# Patient Record
Sex: Female | Born: 1960 | Race: White | Hispanic: No | Smoking: Former smoker
Health system: Southern US, Community
[De-identification: ages and names within clinical notes are randomized; demographics above are authoritative.]

## PROBLEM LIST (undated history)

## (undated) DIAGNOSIS — I10 Essential (primary) hypertension: Secondary | ICD-10-CM

## (undated) DIAGNOSIS — E119 Type 2 diabetes mellitus without complications: Secondary | ICD-10-CM

## (undated) DIAGNOSIS — F988 Other specified behavioral and emotional disorders with onset usually occurring in childhood and adolescence: Secondary | ICD-10-CM

## (undated) HISTORY — PX: CHOLECYSTECTOMY: SHX55

## (undated) HISTORY — PX: ABDOMINAL HYSTERECTOMY: SHX81

---

## 1999-03-10 ENCOUNTER — Other Ambulatory Visit: Admission: RE | Admit: 1999-03-10 | Discharge: 1999-03-10 | Payer: Self-pay | Admitting: Gynecology

## 1999-08-19 ENCOUNTER — Ambulatory Visit (HOSPITAL_COMMUNITY): Admission: RE | Admit: 1999-08-19 | Discharge: 1999-08-19 | Payer: Self-pay | Admitting: Gastroenterology

## 1999-08-19 ENCOUNTER — Encounter: Payer: Self-pay | Admitting: Gastroenterology

## 1999-09-07 ENCOUNTER — Encounter: Payer: Self-pay | Admitting: General Surgery

## 1999-09-10 ENCOUNTER — Encounter (INDEPENDENT_AMBULATORY_CARE_PROVIDER_SITE_OTHER): Payer: Self-pay | Admitting: Specialist

## 1999-09-10 ENCOUNTER — Encounter: Payer: Self-pay | Admitting: General Surgery

## 1999-09-10 ENCOUNTER — Observation Stay (HOSPITAL_COMMUNITY): Admission: RE | Admit: 1999-09-10 | Discharge: 1999-09-11 | Payer: Self-pay | Admitting: General Surgery

## 1999-09-28 ENCOUNTER — Encounter: Payer: Self-pay | Admitting: General Surgery

## 1999-09-28 ENCOUNTER — Ambulatory Visit (HOSPITAL_COMMUNITY): Admission: RE | Admit: 1999-09-28 | Discharge: 1999-09-28 | Payer: Self-pay | Admitting: General Surgery

## 1999-09-29 ENCOUNTER — Encounter: Payer: Self-pay | Admitting: General Surgery

## 1999-09-29 ENCOUNTER — Inpatient Hospital Stay (HOSPITAL_COMMUNITY): Admission: EM | Admit: 1999-09-29 | Discharge: 1999-10-02 | Payer: Self-pay | Admitting: General Surgery

## 1999-09-30 ENCOUNTER — Encounter: Payer: Self-pay | Admitting: General Surgery

## 1999-10-01 ENCOUNTER — Encounter: Payer: Self-pay | Admitting: General Surgery

## 1999-10-02 ENCOUNTER — Encounter: Payer: Self-pay | Admitting: General Surgery

## 1999-10-21 ENCOUNTER — Encounter: Payer: Self-pay | Admitting: Gastroenterology

## 1999-10-21 ENCOUNTER — Encounter: Admission: RE | Admit: 1999-10-21 | Discharge: 1999-10-21 | Payer: Self-pay | Admitting: Gastroenterology

## 2000-01-11 ENCOUNTER — Ambulatory Visit (HOSPITAL_COMMUNITY): Admission: RE | Admit: 2000-01-11 | Discharge: 2000-01-11 | Payer: Self-pay | Admitting: Gastroenterology

## 2000-01-27 ENCOUNTER — Encounter: Payer: Self-pay | Admitting: Emergency Medicine

## 2000-01-27 ENCOUNTER — Emergency Department (HOSPITAL_COMMUNITY): Admission: EM | Admit: 2000-01-27 | Discharge: 2000-01-27 | Payer: Self-pay | Admitting: Emergency Medicine

## 2002-12-24 ENCOUNTER — Observation Stay (HOSPITAL_COMMUNITY): Admission: EM | Admit: 2002-12-24 | Discharge: 2002-12-26 | Payer: Self-pay | Admitting: Emergency Medicine

## 2002-12-24 ENCOUNTER — Encounter: Payer: Self-pay | Admitting: Emergency Medicine

## 2002-12-26 ENCOUNTER — Encounter: Payer: Self-pay | Admitting: Cardiology

## 2005-08-24 ENCOUNTER — Encounter: Admission: RE | Admit: 2005-08-24 | Discharge: 2005-08-24 | Payer: Self-pay | Admitting: Family Medicine

## 2006-07-17 ENCOUNTER — Ambulatory Visit: Payer: Self-pay | Admitting: Cardiology

## 2006-07-17 ENCOUNTER — Encounter: Payer: Self-pay | Admitting: Cardiology

## 2006-07-17 ENCOUNTER — Inpatient Hospital Stay (HOSPITAL_COMMUNITY): Admission: EM | Admit: 2006-07-17 | Discharge: 2006-07-19 | Payer: Self-pay | Admitting: Emergency Medicine

## 2006-09-06 ENCOUNTER — Encounter: Admission: RE | Admit: 2006-09-06 | Discharge: 2006-09-06 | Payer: Self-pay | Admitting: Family Medicine

## 2007-04-11 ENCOUNTER — Emergency Department (HOSPITAL_COMMUNITY): Admission: EM | Admit: 2007-04-11 | Discharge: 2007-04-11 | Payer: Self-pay | Admitting: Emergency Medicine

## 2007-04-13 ENCOUNTER — Emergency Department (HOSPITAL_COMMUNITY): Admission: EM | Admit: 2007-04-13 | Discharge: 2007-04-13 | Payer: Self-pay | Admitting: *Deleted

## 2007-08-09 ENCOUNTER — Emergency Department (HOSPITAL_COMMUNITY): Admission: EM | Admit: 2007-08-09 | Discharge: 2007-08-09 | Payer: Self-pay | Admitting: Emergency Medicine

## 2008-02-19 ENCOUNTER — Encounter: Admission: RE | Admit: 2008-02-19 | Discharge: 2008-02-19 | Payer: Self-pay | Admitting: Family Medicine

## 2009-07-20 ENCOUNTER — Emergency Department (HOSPITAL_COMMUNITY): Admission: EM | Admit: 2009-07-20 | Discharge: 2009-07-20 | Payer: Self-pay | Admitting: Emergency Medicine

## 2009-08-18 ENCOUNTER — Encounter: Admission: RE | Admit: 2009-08-18 | Discharge: 2009-08-18 | Payer: Self-pay | Admitting: Family Medicine

## 2010-08-23 ENCOUNTER — Encounter: Admission: RE | Admit: 2010-08-23 | Discharge: 2010-08-23 | Payer: Self-pay | Admitting: Family Medicine

## 2011-03-30 IMAGING — CR DG TOE GREAT 2+V*R*
3 series · 3 of 3 positions shown · non-contrast
Comparison: None

CLINICAL DATA: Injury. Torn toenail

RIGHT TOE - 2+ VIEW

[t toes ap right]
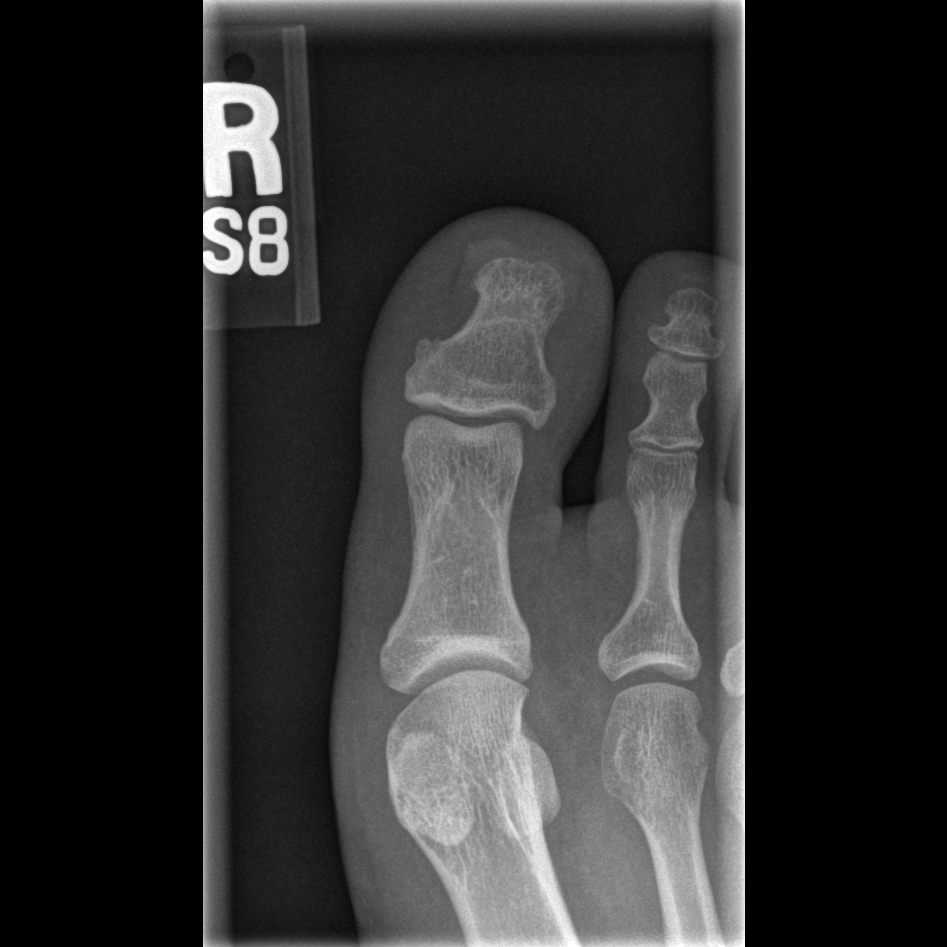

[t toes oblique right]
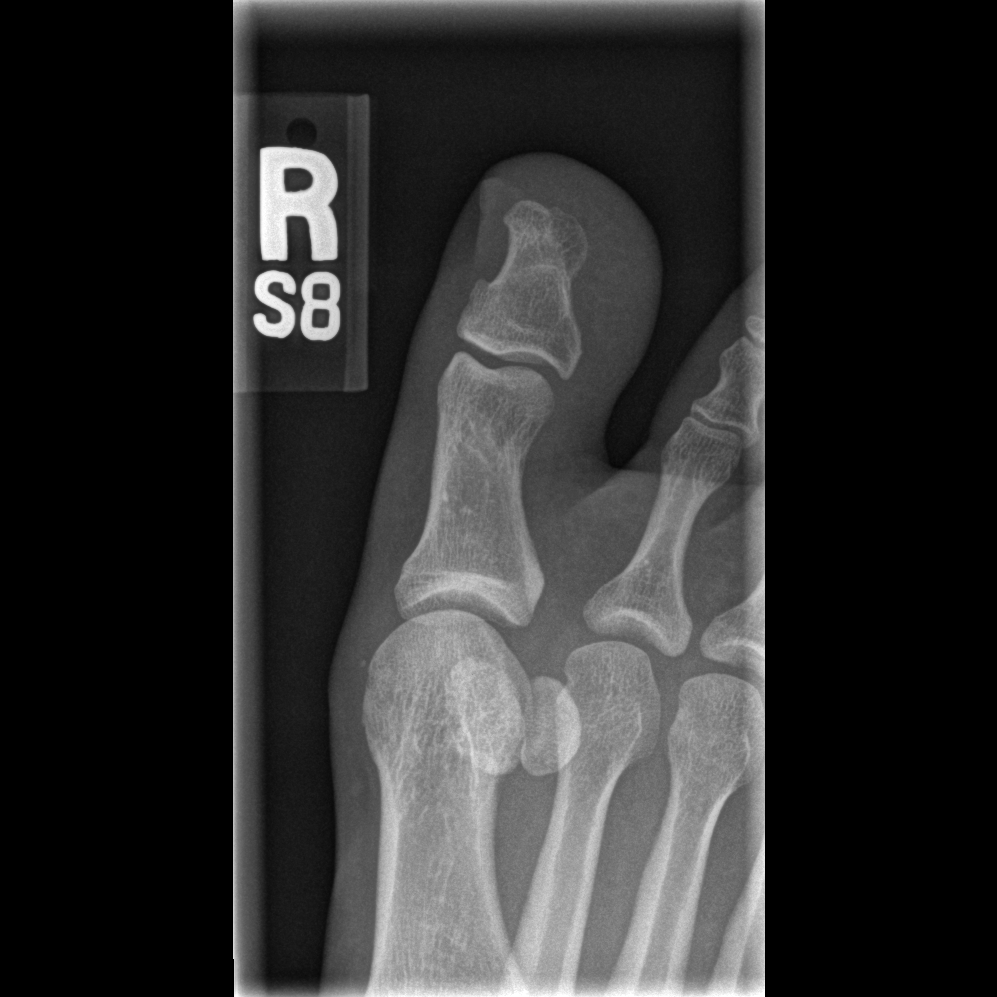

[t toes lateral right]
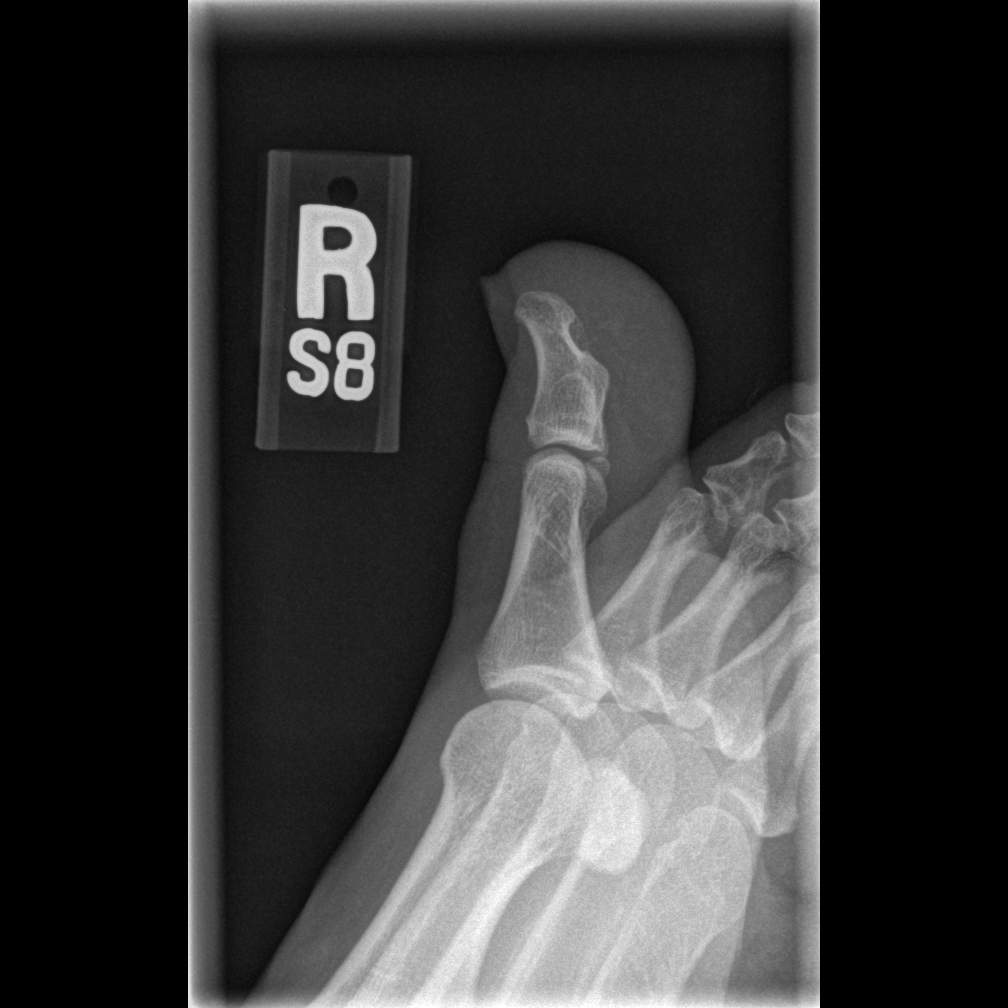

[3 of 3 positions shown; findings below may reference images not displayed]

FINDINGS: There is no evidence of fracture or dislocation.  There
is no evidence of arthropathy or other focal bone abnormality.
Soft tissues are unremarkable.
IMPRESSION: Negative.

## 2011-04-08 NOTE — Discharge Summary (Signed)
NAMEALVEENA, Sonya Daugherty NO.:  [id number]   MEDICAL RECORD NO.:  [id number]          PATIENT TYPE:  OBV   LOCATION:  2008                         FACILITY:  [facility]   PHYSICIAN:  Theodore Demark, PA-C   DATE OF BIRTH:  Apr 09, 1961   DATE OF ADMISSION:  07/16/2006  DATE OF DISCHARGE:                                 DISCHARGE SUMMARY   PROCEDURES:  Gated exercise treadmill Myoview.   DISCHARGE DIAGNOSES:  1. Chest pain.  2. Hypertension.  3. Hyperlipidemia.  4. Dizziness and confusion at the time of chest pain which has resolved.  5. Remote history of tobacco abuse.  6. Remote history of C-section x3, cholecystectomy and hysterectomy as      well as melanoma.  7. History of melanoma with resection.  8. History of atypical chest pain, 2001-1004  9. Status post EGD and colonoscopy secondary to right upper quadrant and      epigastric pain with blood in the stool.  10.Attention deficit disorder.  11.Depression and anxiety.  12.Allergy or intolerance to CODEINE AND VICODIN.  13.Family history of premature coronary artery disease in her father and      brother.   Time at discharge 34 minutes.   HOSPITAL COURSE:  [name] is a 50 year old female with a previous  history of evaluation for chest pain in 2004 but no known coronary artery  disease.  She had chest pain onset while playing basketball that was  associated with dizziness and confusion.  She came to the hospital where she  was admitted for further evaluation and treatment.   Her cardiac enzymes were negative for MI.  She had a gated exercise  treadmill stress test which was without ischemic changes on her EKG and  Myoview images are pending at the time of dictation.  She had recurrence of  pain for which she was treated with Darvocet.  At this time her Myoview  images are not read in evaluation by Dr. Dietrich Pates is pending.  If he does  not feel any further inpatient workup is indicated and the stress test  is  without ischemia or infarct, she is tentatively considered stable for  discharge with outpatient follow-up arranged.   DISCHARGE INSTRUCTIONS:  Her activity level is to be increased gradually.  She is to stick to a low-fat diet.  She is to follow up with the PA for Dr.  Dietrich Pates in the [facility] office on September 11 at 9:15 and with Dr. Sigmund Hazel as needed or as scheduled.   DISCHARGE MEDICATIONS:  1. Altace 10 mg q.d.  2. HCTZ 25 mg a day.  3. Wellbutrin 300 mg a day.  4. Aspirin 325 mg a day.  5. Ativan 0.5 mg q.4 h p.r.n.           ______________________________  Theodore Demark, PA-C     RB/MEDQ  D:  07/17/2006  T:  07/18/2006  Job:  [id number]   cc:   Sigmund Hazel, M.D.

## 2011-04-08 NOTE — Cardiovascular Report (Signed)
[name], [name] NO.:  [id number]   MEDICAL RECORD NO.:  [id number]          PATIENT TYPE:  OBV   LOCATION:  2008                         FACILITY:  [facility]   PHYSICIAN:  Sonya Pick. Nishan, MD,FACCDATE OF BIRTH:  1960-12-18   DATE OF PROCEDURE:  DATE OF DISCHARGE:                              CARDIAC CATHETERIZATION   PROCEDURE:  TEE.   INDICATIONS:  Mental status changes in a 50 year old   This study was requested by neurology. The patient was sedated with 8 mg of  Versed and 100 mcg of fentanyl.   Using digital technique, an Omniplane probe was advanced in the distal  esophagus without incident.   Transgastric imaging revealed normal LV function with an EF of 60%.  Mitral,  aortic, tricuspid and pulmonary valves were structurally normal.  There was  mild mitral insufficiency but no prolapse.   Atrial septum was intact by color-flow, there is no PFO, bubble study was  negative for right-to-left shunt. Imaging of the left atrial appendage was  normal with no spontaneous contrast and no thrombus. Imaging of the aorta  showed no significant debris.   IMPRESSION:  1. No source of embolus.  2. Negative bubble study.  3. No patent foramen ovale.  4. Mild mitral regurgitation.  5. Normal aortic, tricuspid and pulmonary valves.  6. Normal left ventricular function.  7. Normal right-sided cardiac chambers.  8. No aortic debris.           ______________________________  Sonya Pick. Eden Emms, MD,FACC     PCN/MEDQ  D:  07/18/2006  T:  07/19/2006  Job:  [id number]

## 2011-04-08 NOTE — Procedures (Signed)
[place]. [facility]  Patient:    Sonya Daugherty, Sonya Daugherty                   MRN: [id number] Proc. Date: 01/11/00 Adm. Date:  [id number] Attending:  Charna Elizabeth CC:         Gaetano Hawthorne. Lily Peer, M.D.             Adolph Pollack, M.D.                           Procedure Report  DATE OF BIRTH:  10/14/1961  REFERRING PHYSICIAN:  Gaetano Hawthorne. Lily Peer, M.D.  PROCEDURE PERFORMED:  Colonoscopy.  ENDOSCOPIST:  Anselmo Rod, M.D.  INSTRUMENT USED:  Olympus video colonoscope.  INDICATIONS:  Rectal bleeding and abdominal pain in a 50 year old white female.  Rule out masses, polyps, etc.  PREPROCEDURE PREPARATION:  Informed consent was procured from the patient.  The  patient was fasted for 8 hours prior to the procedure and prepped with a bottle of magnesium citrate and a gallon of NuLytely the night prior to the procedure.  PREPROCEDURE PHYSICAL:  Patient has stable vital signs.  NECK: Supple.  CHEST:  Clear to auscultation. S1, S2 regular.  ABDOMEN:  Soft with normal abdominal bowel sounds.  Epigastric and right upper quadrant tenderness on palpation.  DESCRIPTION OF PROCEDURE:  The patient was placed in left lateral decubitus position and sedated with an additional 20 mg of Demerol and 2 mg of Versed intravenously.   Once the patient was adequately sedated and maintained on low-flow oxygen and continuous cardiac monitoring, the Olympus video colonoscope was advanced from the rectum to the cecum without difficulty.  Except for small, nonbleeding internal hemorrhoids, no other abnormalities were seen.  There was o evidence of masses, polyps, erosions, ulcerations or diverticular disease.  The  patient  tolerated the procedure well without complication.  IMPRESSION: 1. Normal colonoscopy. 2. Small, nonbleeding internal hemorrhoids. 3. No masses or polyps seen.  RECOMMENDATIONS: 1. Patient has been advised to increase the fluid and fiber in  her diet.  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 2. Outpatient follow-up was advised in the next two weeks.  Further    recommendations would be made at that time. DD:  01/11/00 TD:  01/11/00 Job: [id number]

## 2011-04-08 NOTE — H&P (Signed)
Sonya Daugherty, LIMBERT NO.:  [id number]   MEDICAL RECORD NO.:  [id number]          PATIENT TYPE:  EMS   LOCATION:  [facility]JO                         FACILITY:  [facility]   PHYSICIAN:  Marrian Salvage. Freida Busman, MD     DATE OF BIRTH:  [birth year]   DATE OF ADMISSION:  07/17/2006  DATE OF DISCHARGE:                                HISTORY & PHYSICAL   CARDIOLOGIST:  Gerrit Friends. Dietrich Pates, M.D.   PRIMARY MEDICAL DOCTOR:  Sigmund Hazel, M.D. at [facility].   CHIEF COMPLAINT:  Chest pain, presyncope and confusion.   HISTORY OF PRESENT ILLNESS:  The patient is a 50 year old female with  cardiac risk factors of hypertension, prior tobacco, borderline  hyperlipidemia and a strong positive family history for coronary disease.  She has a history of chest pain back in March 2001 with a negative  evaluation and negative workup for a pulmonary embolus.  She was again  admitted to [facility] in February 2004 for chest pain at that time had rule-  out myocardial infarction that was negative as well as an exercise  Cardiolite on December 26, 2002 which evoked her chest pain, showed an EF of  62%, and showed no significant perfusion abnormality on stress or rest.   The patient has been treated for some hypertension.  Recently, she had some  headaches and anxiety.  She was put on Ativan a couple of weeks ago.  Due to  the headaches, she saw her primary medical doctor on Thursday, July 13, 2006.  At that point in time, her Altace was increased from 5 mg to 10 mg  and she was given a prescription for hydrochlorothiazide.  The following  day, July 14, 2006, at work, she felt very stressed out and while sitting  at the phone, she noted her heart pounding.  She had some mild shortness of  breath associated with this.  However, there was no chest pain and no  lightheadedness or dizziness.  She has somebody in office to come and  evaluate her and a blood pressure at that time was 160/114.  She rested  and  felt better and consequently did not seek further medical care.   She did well over the remainder the weekend, until tonight, July 16, 2006.  She and her family went out to play basketball about 6:45 p.m.  They are  very serious about their basketball and she had heavy exertion.  She started  getting some dull tightness and pain in the left clavicular region.  At most  it was 06/10 in severity and the pain waxed and waned.  She kept playing  basketball.  Eventually, she started getting dizzy.  She sat down and then  became confused.  She does not remember the details of the event after that.  Her husband says she was quite dizzy and became somewhat confused,  disoriented and a little bit combative.  This was around 8:30 p.m.  She was  in a cold sweat.  The weather was quite hot and humid, as it is in mid  August.  She remained disoriented  for about 10-15 minutes.  EMS was called.  By the time they arrived, she was improved.  Their initial evaluation was  negative.   In the emergency department, the patient was mildly hypertensive.  Heart  rate was normal.  Her disorientation had resolved.  She was in no  significant chest pain.  She was given some aspirin and we were called for  admission.   PAST MEDICAL HISTORY:  1. Hypertension.  2. Hyperlipidemia.  3. Prior tobacco abuse.  4. History of C-section x3.  5. Status post cholecystectomy.  6. Status post hysterectomy with unilateral salpingo-oophorectomy.  7. History of melanoma, status post resection in 2000.  8. History of atypical chest pain in 2001 and 2004.  9. History of EGD, which was negative.  10.Attention deficit disorder.  11.Depression and anxiety.   ALLERGIES:  Codeine and VICODIN, which both cause nausea.   MEDICATIONS:  1. Altace 10 mg, which was recently increased from 5 mg.  2. Hydrochlorothiazide 25 mg daily, which the patient has not yet filled      the prescription for and has not had started.  3.  Ativan 0.5 mg as needed for anxiety.  4. Adderall 30 mg in the morning, 20 mg at night.  5. Wellbutrin 300 mg in the morning.   SOCIAL HISTORY:  The patient lives in [place] with her husband.  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  She  says her job is quite stressful.  The patient has 3 children from a prior  marriage.  She smoked for about 30 years, but quit in January of this year.  She does not use significant alcohol.  She denied any illicit drug use and  specifically denied cocaine use.   FAMILY HISTORY:  The patient's father died of a myocardial infarction at age  103.  She has a brother who had a CABG at age 80.  Her mother is alive and  well, but has diabetes and heart failure.  She has another brother who died  of brain cancer at age 40.   REVIEW OF SYSTEMS:  Positive for chest pain, confusion and stress.  Otherwise, 14 systems were reviewed and negative in detail.   ADVANCE DIRECTIVES:  Full code.   PHYSICAL EXAMINATION:  VITAL SIGNS:  Temperature 98, pulse 76, respirations  16, blood pressure 114/85, saturation 99% on 2 L.  Her weight is 160 pounds.  GENERAL:  She was in no distress.  SKIN:  No rash.  No diaphoresis.  NECK:  No carotid bruits.  JVP flat.  Thyroid without enlargement.  LUNGS:  Clear to auscultation bilaterally.  HEART:  Regular rate and rhythm with normal S1 and S2, no S4 and no murmur.  ABDOMEN:  Soft, nontender.  Her right groin had 1+ pulse with no bruit.  EXTREMITIES:  1+ PT pulses bilaterally.  No edema.  Warm and well-perfused.  NEUROLOGICAL:  The patient was alert and oriented to complex questioning.  She was appropriate.  No slurred speech.  Her pupils were equal.  Face  symmetric.  Strength normal.  She was able to stand.  BACK:  Of note, the patient had a tattoo on her back.   ACCESSORY CLINICAL DATA:  EKG showed a rate of 76, in sinus rhythm with normal axis, normal intervals, no Q waves, T wave  inversions in V1 and V2 as  well as lead aVL and 0.5-mm P-R depression in inferior leads, but no  significant ST  changes.  No hypertrophy.  She had episode of mild chest pain  in the emergency department and that repeat EKG showed no change from prior.   LABORATORY DATA:  Hematocrit 48.  Sodium 137, potassium 3.6, BUN 7,  creatinine 0.8, glucose 87.  MB 1.8, troponin negative.   ASSESSMENT AND PLAN:  [age]-year-old female who presents with atypical  chest pain, dizziness and confusion.  Her symptoms have now resolved.   1. Chest pain.  She does have multiple cardiac risk factors.  However, her      chest pain is somewhat atypical.  So far, workup is negative.  She will      be put on Telemetry.  Troponins x3.  Aspirin.  I would hold off on      heparin and IIb/IIIa, given my low suspicion currently.  She will get      continued ACE inhibitor.  We will check fasting lipids and start a      statin if indicated.  I would hold her Adderall and consider titrating      down her dose to off permanently, given her history of cardiac disease.      I have scheduled the patient for an exercise Cardiolite tomorrow.      Based on those results, I will make further decisions in her care.  2. Change in mental status.  It is difficult for me to get  a sense of the      severity of her delirium while playing basketball.  Her neurological      exam is normal now.  I am not sure she just got dehydrated and/or some      mild heat stroke.  It certainly has been very hot out and she could      have overexerted.  At this in point time, we will give her some mild      intravenous fluid resuscitation.  I will check a urine toxicology as      well.  I do not see indication for head CT at this point in time.      Electrolytes are normal.  3. Prophylaxis.  Out of bed.  Regular diet after stress test if that is      negative.   DISPOSITION:  Hopefully home tomorrow if her stress is negative.  She can  follow  up with her primary care physician.           ______________________________  Marrian Salvage. Freida Busman, MD     LAA/MEDQ  D:  07/17/2006  T:  07/17/2006  Job:  [id number]   cc:   Sigmund Hazel, M.D.

## 2011-04-08 NOTE — Consult Note (Signed)
Sonya Daugherty, Sonya Daugherty            ACCOUNT NO.:  [id number]   MEDICAL RECORD NO.:  [id number]          PATIENT TYPE:  OBV   LOCATION:  2008                         FACILITY:  [facility]   PHYSICIAN:  Pramod P. Pearlean Brownie, MD    DATE OF BIRTH:  [birth year]   DATE OF CONSULTATION:  07/17/2006  DATE OF DISCHARGE:                                   CONSULTATION   REFERRING PHYSICIAN:  Gerrit Friends. Dietrich Pates, MD   REASON FOR REFERRAL:  Confusion and dizziness.   HISTORY OF PRESENT ILLNESS:  Sonya Daugherty is a 50 year old Caucasian lady who  had sudden onset of her chest pain while playing __________ basketball with  her friends this afternoon.  She states she had the pain initially  __________  and took a breather but continued to play despite the pain and  she felt dizzy, lightheaded and off balance and had to sit down and she felt  confused and disoriented.  Her friends who were playing with her were  present in the room and gave history that they found that she looked a  little dazed and was awake, no loss of consciousness but was slow to respond  to questions and was not her usual self.  The patient was able to walk  without significant balance problems and did not have any headache or vision  problems.  She was seen in the emergency room where she gradually returned  back to baseline about 15 minutes.  She has had previous history of chest  pain and negative cardiac workup in 2001.  She is presently admitted to the  cardiology unit and this is being ruled out for myocardial infarction.  She  had a previous negative evaluation for pulmonary embolus for similar chest  pain in March 2001 as per chart.  Past neurological history is significant  for migraine headaches but no previous history of stroke, TIA, seizures.   PAST MEDICAL HISTORY:  Hypertension, hyperlipidemia, prior tobacco abuse.  Anxiety, depression, attention deficit.   PAST SURGICAL HISTORY:  Gallbladder, hysterectomy, bilateral  salpingo-  oophorectomy, melanoma resection.   ALLERGIES TO MEDICATIONS:  CODEINE AND VICODIN cause nausea.   HOME MEDICATIONS:  Altace, hydrochlorothiazide __________ and Wellbutrin.   SOCIAL HISTORY:  The patient lives in [place] with her husband.  She  works as a Nurse, mental health at [employer].  She does not smoke  at present and quit in January of this year after smoking 30 years.  Does  not drink alcohol and denies doing drugs.   FAMILY HISTORY:  Is not significant for anybody with neurological problems  except a brother who had brain cancer at age 15.   REVIEW OF SYSTEMS:  Positive for confusion, dizziness, chest pain,  disorientation.  Other systems 14 systems are negative.   PHYSICAL EXAM:  GENERAL:  Reveals a pleasant obese Caucasian lady who is not  in distress.  VITAL SIGNS:  Afebrile, pulse rate 72,, blood pressure 104/85, sats 99% on 2  liters.  Distal pulses are well felt, head is nontraumatic.  Neck is supple without  bruit.  ENT exam unremarkable.  Cardiac exam  normal gallop.  Lungs clear to  auscultation.  NEUROLOGIC EXAM:  She is awake, alert, oriented x3 with normal speech and  language function.  There is no aphasia, apraxia or dysarthria.  She can  follow commands well.  Eye movements are full range without nystagmus.  Visual fields are full to confrontation testing.  Visual acuity seem  adequate.  Face is symmetric.  Palatal movements are normal.  Tongue is  midline.  Motor system exam reveals no upper extremity drift, symmetric  strength, tone, reflexes coordination, sensation.  Gait was not tested.   DATA REVIEWED:  Troponin is negative.  Cardiac MB is 1.8.  Electrolytes  normal.  Hematocrit is 48.   IMPRESSION:  50 year old  lady with episode of typical chest pain and  transient dizziness and confusion the setting of heavy physical exertion  playing basketball.  The setting is certainly suspicious for paradoxical  embolism and it would  be prudent to get MRI scan of the brain to evaluate  for any stroke as well as check a transesophageal echocardiogram for patent  foramen ovale.  Approximately cardiac workup and workup for thromboembolism  has we feel necessary.  Thank you for this consult.  I look forward to  seeing the patient in follow up.           ______________________________  Sunny Schlein. Pearlean Brownie, MD     PPS/MEDQ  D:  07/17/2006  T:  07/18/2006  Job:  [id number]

## 2011-04-08 NOTE — Procedures (Signed)
[place]. [facility]  Patient:    SECILIA, APPS                   MRN: [id number] Proc. Date: 01/11/00 Adm. Date:  [id number] Attending:  Charna Elizabeth CC:         Gaetano Hawthorne. Lily Peer, M.D.             Adolph Pollack, M.D.                           Procedure Report  DATE OF BIRTH:  05/12/1961  REFERRING PHYSICIAN:  Gaetano Hawthorne. Lily Peer, M.D.  PROCEDURE PERFORMED:  Esophagogastroduodenoscopy with biopsies.  ENDOSCOPIST:  Anselmo Rod, M.D.  INSTRUMENT USED:  Olympus video panendoscope.  INDICATIONS:  Right upper quadrant and epigastric pain with blood in stool in a  50 year old white female.  Rule out peptic ulcer disease, esophagitis, gastritis, etc.  Patient has a previous history of ulcer disease.  PREPROCEDURE PREPARATION:  Informed consent was procured from the patient.  The  patient was fasted for 8 hours prior to the procedure.  PREPROCEDURE PHYSICAL:  Patient has stable vital signs.  NECK:  Supple.  CHEST:  Clear to auscultation. S1, S2 regular.  ABDOMEN:  Soft with normal abdominal bowel sounds, no hepatosplenomegaly, no masses palpable.  Epigastric and right upper quadrant tenderness on palpation with guarding, no rebound, rigidity.  DESCRIPTION OF PROCEDURE:  The patient was placed in left lateral decubitus position and sedated with 60 mg of Demerol and 6 mg of Versed intravenously. Once the patient was adequately sedated and maintained on low-flow oxygen and continuous cardiac monitoring, the Olympus video panendoscope was advanced through the mouth piece, over the tongue into the esophagus under direct vision.  The entire esophagus appeared normal without evidence of rings, strictures, masses, lesions or esophagitis.  The scope was then advanced into the stomach.  There was moderate  diffuse gastritis throughout the gastric mucosa.  No frank ulcers, AVMs, masses or polyps were seen.  The duodenal bulb and the  small bowel distal to the bulb appeared normal.  There was no outlet obstruction.  The patient tolerated the procedure well without complications.  Biopsies were done from the antrum to rule out presence of Helicobacter pylori by CLOtest.  IMPRESSION:  Normal esophagogastroduodenoscopy except for moderate diffuse gastritis.  CLOtest done, results pending.  RECOMMENDATIONS: 1. Resume use of Prevacid 30 mg 1 p.o. q.day 1 hour before breakfast. 2. Outpatient follow-up to follow up on CLOtest results and treat with antibiotics   if CLO positive. 3. Proceed with colonoscopy at this time. DD:  01/11/00 TD:  01/11/00 Job: [id number]

## 2011-04-08 NOTE — H&P (Signed)
NAME:  Sonya Daugherty, Sonya Daugherty                      ACCOUNT NO.:  [id number]   MEDICAL RECORD NO.:  [id number]                   PATIENT TYPE:  INP   LOCATION:  6527                                 FACILITY:  [facility]   PHYSICIAN:  [place] Bing, M.D. [facility]           DATE OF BIRTH:   DATE OF ADMISSION:  12/24/2002  DATE OF DISCHARGE:  12/26/2002                                HISTORY & PHYSICAL   BRIEF HISTORY:  This is a 50 year old female admitted to [facility]  12/24/02 for evaluation of chest pain.  She was seen by Dr. Dietrich Pates at time  of admission.  The patient has a history of tobacco use.  She has a strong  family history of coronary artery disease although she has generally been in  good health.  The patient has had borderline cholesterol levels.  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  Please see dictated H&P for full details regarding the patient's chest pain.  She was admitted for further evaluation.   PAST MEDICAL HISTORY:  The patient has had three cesarean sections.  She is  status post cholecystectomy, status post abdominal  hysterectomy with  unilateral oophorectomy.  She underwent resection of a melanoma four years  ago and is now in NED.  Past medical records were not available at time of  admission.  The patient was also seen in 3/01 for evaluation of chest pain.  She had a CT scan of the chest at the time that was negative for pulmonary  embolus.  She also had an endoscopy at some time in the past performed by  Dr. Loreta Ave.   ALLERGIES:  CODEINE AND VICODIN CAUSE NAUSEA.   ADMISSION MEDICATIONS:  None.   SOCIAL HISTORY:  The patient lives in [place] with her husband and three  children.  She has a 25 pack year  tobacco history.  She does not use  alcohol.  She works for an [employer].   FAMILY HISTORY:  The patient's father had multiple myocardial infarctions.  She has five siblings including two brothers who had  clinically manifested  coronary artery disease in their 82s.   HOSPITAL COURSE:  As noted, this patient  was admitted for further  evaluation of chest pain.  She was found to have negative cardiac enzymes.  She underwent an  exercise Cardiolite on 12/26/02.  The patient did have pain  during this study.  However, the images were read as no ischemia with an EF  of 63%.  This was discussed with Dr. Dietrich Pates and the decision was made to  discharge the patient in stable condition.  The patient was anxious for  discharge.  She was told if she had further chest pain to either contact Dr.  Ruthine Dose or [facility].   LABORATORY DATA:  As noted, cardiac enzymes were found to be negative.  Please see results of the  Cardiolite as noted above.  A CBC on the 4th  revealed hemoglobin 14.4, hematocrit 41.5, WBC 6.3000, platelets 223,000.  Chemistries on the 4th revealed a BUN of 7, creatinine 0.7, potassium 3.7,  glucose 103.  A lipid profile  revealed a cholesterol of 181, triglycerides  86, HDL 48, LDL 116.  A chest x-ray showed no active disease.  EKG showed  normal sinus rhythm and was interpreted as a normal EKG.   DISCHARGE MEDICATIONS:  Enteric coated aspirin 81 mg daily.   ACTIVITY:  As tolerated.   The patient was told to stay on a low salt, low fat diet.  She was told to  call Dr. Ruthine Dose for followup appointment.  She was to follow up with Dr.  Dietrich Pates as needed.   PROBLEM LIST AT DISCHARGE:  1. Chest pain.  Myocardial infarction ruled out.  2. Negative exercise Cardiolite performed 12/26/02.  3. History of melanoma.  4. History of tobacco use.  5. Previous upper endoscopy.  6. History of high homocystine levels.  7. Status post multiple surgeries as listed above.     Delton See, P.A. [facility]                  [place] Bing, M.D. [facility]    DR/MEDQ  D:  12/26/2002  T:  12/26/2002  Job:  [id number]   cc:   Angelena Sole, M.D. [facility]

## 2011-04-08 NOTE — Discharge Summary (Signed)
NAMEJACAYLA, NORDELL NO.:  [id number]   MEDICAL RECORD NO.:  [id number]          PATIENT TYPE:  OBV   LOCATION:  2008                         FACILITY:  [facility]   PHYSICIAN:  Gerrit Friends. Dietrich Pates, MD, FACCDATE OF BIRTH:  [birth year]   DATE OF ADMISSION:  07/16/2006  DATE OF DISCHARGE:  07/19/2006                                 DISCHARGE SUMMARY   ADDENDUM:  Dr. Dietrich Pates evaluated Ms. Colunga and felt that a neuro consult  was indicated.  She was seen in consultation by Dr. Pearlean Brownie of neurology.  He  recommended an MRI and MRA as well as a TEE to look for PFO.  The TEE was  performed on July 18, 2006 and the atrial septum was intact by color flow.  There was no PFO.  Bubble study was negative for right to left shunt.  There  was no thrombus in the left atrial appendage and imaging of the aorta showed  no significant debris.  She had normal left ventricular function and normal  right-sided cardiac chambers.  The MRI and MRA showed no acute intracranial  abnormality.  There were very small polyps or mucous retention cyst within  one anterior ethmoid air cell on either side and superior aspect of the  right maxillary sinus.  There was mild prominence of the pituitary gland  which measured up to 8 mm in cephalocaudal dimension.  The stalk appeared to  be midline and is mass-effect in the optic chiasm.  Pituitary microadenoma  was not excluded.  The MRA of circle of Willis was negative and the MRA of  the neck was negative.   On July 19, 2006, Ms. Frankum had had no further episodes.  She was  ambulating without chest pain or shortness of breath or dizziness.  She was  evaluated by Dr. Dietrich Pates and considered stable for discharge with  outpatient follow up EEG and follow up with neurology.  Discharge  instructions are unchanged from previously dictated discharge summary.     ______________________________  Theodore Demark, PA-C      Gerrit Friends. Dietrich Pates, MD, [facility]  Electronically Signed    RB/MEDQ  D:  07/19/2006  T:  07/19/2006  Job:  [id number]   cc:   Sigmund Hazel, M.D.  Pramod P. Pearlean Brownie, MD

## 2011-04-08 NOTE — H&P (Signed)
NAME:  Sonya Daugherty, Sonya Daugherty                      ACCOUNT NO.:  [id number]   MEDICAL RECORD NO.:  [id number]                   PATIENT TYPE:  EMS   LOCATION:  MINO                                 FACILITY:  [facility]   PHYSICIAN:   Bing, M.D. [facility]           DATE OF BIRTH:  1961-06-30   DATE OF ADMISSION:  12/24/2002  DATE OF DISCHARGE:                                HISTORY & PHYSICAL   HISTORY OF PRESENT ILLNESS:  The patient is a 50 year old woman with history  of cigarette smoking and markedly positive family history for coronary  artery disease presents with left upper chest and shoulder discomfort. Ms.  Daugherty has enjoyed generally good health. She has no known cardiovascular  disease. Over the past week or so, she has suffered two episodes of  moderately severe upper left chest aching. These occurred suddenly without  significant exertion. There was associated diaphoresis with nausea but no  dyspnea. There was some heaviness in her left arm today. The discomfort  persisted for approximately one hour but was essentially gone by the time  she arrived in the emergency room. The patient denies prior hypertension or  diabetes mellitus. She has borderline cholesterol. She has been found to  have high homocystine levels and advised to take folate.   PAST MEDICAL HISTORY:  Notable for three C-sections, cholecystectomy, and  abdominal hysterectomy with unilateral oophorectomy. She underwent resection  of a melanoma four years ago and is now NED. Prior hospital chart has become  available. She was seen in March 2001 for chest pain of sudden onset. She  had endoscopy at some time before that and an evaluation was performed by  Dr. Wilson Singer. Her evaluation in the emergency room was negative and she was  discharged to home.   SOCIAL HISTORY:  She lives in [place] with her husband and three  children. She has a 25 pack year history of cigarette smoking. No excessive  use of alcohol.  Works for an [employer].   FAMILY HISTORY:  Father suffered multiple myocardial infarctions. She has  five siblings including two brothers who had clinically manifest coronary  artery disease in their 16's.   REVIEW OF SYSTEMS:  All negative.   PHYSICAL EXAMINATION:  GENERAL: A very pleasant, well developed, well  nourished  woman.  VITAL SIGNS: Temperature 97.7, blood pressure 140/95, heart rate 80 and  regular and respiratory rate 20.  HEENT: Anicteric sclera.  NECK: No jugular venous distention..  ENDOCRINE: No thyromegaly.  LUNGS: Clear.  CARDIAC: Normal first and second heart sounds. Fourth heart sound present.  ABDOMEN: Soft and nontender.  EXTREMITIES: Normal distal pulses. No edema.  NEURO: Symmetric strength and tone.   DIAGNOSTIC IMPRESSION:  EKG normal sinus rhythm. Within normal limits.   LABORATORY DATA:  Normal including cardiac markers.    IMPRESSION:  Sonya Daugherty presents with somewhat worrisome quality of chest  discomfort, but with relatively low risk and perfectly normal  EKG on  examination. She will be admitted for serial cardiac markers and EKG's. An  outpatient Cardiolite study is anticipated if she experiences no further  symptomatic episodes.                                               [place] Bing, M.D. [facility]    RR/MEDQ  D:  12/24/2002  T:  12/24/2002  Job:  [id number]

## 2011-08-25 ENCOUNTER — Other Ambulatory Visit: Payer: Self-pay | Admitting: Family Medicine

## 2011-08-25 DIAGNOSIS — Z1231 Encounter for screening mammogram for malignant neoplasm of breast: Secondary | ICD-10-CM

## 2011-09-01 LAB — RAPID URINE DRUG SCREEN, HOSP PERFORMED
Benzodiazepines: POSITIVE — AB
Cocaine: NOT DETECTED
Tetrahydrocannabinol: NOT DETECTED

## 2011-09-01 LAB — I-STAT 8, (EC8 V) (CONVERTED LAB)
BUN: 17
Chloride: 105
Hemoglobin: 12.9
Potassium: 4.3
pCO2, Ven: 46.6
pH, Ven: 7.362 — ABNORMAL HIGH

## 2011-09-01 LAB — COMPREHENSIVE METABOLIC PANEL
ALT: 35
AST: 26
Albumin: 3.5
Alkaline Phosphatase: 88
GFR calc Af Amer: >60
Potassium: 4
Sodium: 140
Total Protein: 6.1

## 2011-09-01 LAB — URINALYSIS, ROUTINE W REFLEX MICROSCOPIC
Glucose, UA: NEGATIVE
Hgb urine dipstick: NEGATIVE
Protein, ur: NEGATIVE
Specific Gravity, Urine: 1.027
Urobilinogen, UA: 0.2

## 2011-09-01 LAB — CBC
Hemoglobin: 14.6
Platelets: 239
RDW: 13.6

## 2011-09-01 LAB — DIFFERENTIAL
Basophils Relative: 1
Eosinophils Absolute: 0.1
Lymphs Abs: 2.4
Monocytes Absolute: 0.4
Monocytes Relative: 5

## 2011-09-01 LAB — URINE MICROSCOPIC-ADD ON

## 2011-09-01 LAB — ETHANOL: Alcohol, Ethyl (B): <5

## 2011-09-12 ENCOUNTER — Ambulatory Visit
Admission: RE | Admit: 2011-09-12 | Discharge: 2011-09-12 | Disposition: A | Discharge status: Home / Self Care | Payer: BC Managed Care – PPO | Source: Ambulatory Visit | Attending: Family Medicine | Admitting: Family Medicine

## 2011-09-12 DIAGNOSIS — Z1231 Encounter for screening mammogram for malignant neoplasm of breast: Secondary | ICD-10-CM

## 2011-10-12 ENCOUNTER — Encounter: Payer: Self-pay | Admitting: *Deleted

## 2011-10-12 ENCOUNTER — Emergency Department
Admit: 2011-10-12 | Discharge: 2011-10-12 | Disposition: A | Discharge status: Home / Self Care | Payer: BC Managed Care – PPO

## 2011-10-12 ENCOUNTER — Emergency Department
Admission: EM | Admit: 2011-10-12 | Discharge: 2011-10-12 | Disposition: A | Discharge status: Home / Self Care | Payer: BC Managed Care – PPO | Source: Home / Self Care | Attending: Family Medicine | Admitting: Family Medicine

## 2011-10-12 DIAGNOSIS — S99921A Unspecified injury of right foot, initial encounter: Secondary | ICD-10-CM

## 2011-10-12 DIAGNOSIS — S90129A Contusion of unspecified lesser toe(s) without damage to nail, initial encounter: Secondary | ICD-10-CM

## 2011-10-12 DIAGNOSIS — S93699A Other sprain of unspecified foot, initial encounter: Secondary | ICD-10-CM

## 2011-10-12 DIAGNOSIS — M79674 Pain in right toe(s): Secondary | ICD-10-CM

## 2011-10-12 DIAGNOSIS — S93609A Unspecified sprain of unspecified foot, initial encounter: Secondary | ICD-10-CM

## 2011-10-12 DIAGNOSIS — S93601A Unspecified sprain of right foot, initial encounter: Secondary | ICD-10-CM

## 2011-10-12 DIAGNOSIS — S90111A Contusion of right great toe without damage to nail, initial encounter: Secondary | ICD-10-CM

## 2011-10-12 HISTORY — DX: Essential (primary) hypertension: I10

## 2011-10-12 HISTORY — DX: Other specified behavioral and emotional disorders with onset usually occurring in childhood and adolescence: F98.8

## 2011-10-12 MED ORDER — KETOROLAC TROMETHAMINE 60 MG/2ML IM SOLN
60.0000 mg | Freq: Once | INTRAMUSCULAR | Status: AC
  Administered 2011-10-12: 60 mg via INTRAMUSCULAR

## 2011-10-12 MED ORDER — HYDROCODONE-ACETAMINOPHEN 5-500 MG PO TABS: 1.0000 | ORAL_TABLET | Freq: Every evening | ORAL | Status: AC | PRN

## 2011-10-12 NOTE — ED Provider Notes (Signed)
History     CSN: [id number] Arrival date & time: 10/12/2011 10:18 AM   First MD Initiated Contact with Patient 10/12/11 1102      Chief Complaint  Patient presents with  . Toe Injury      HPI Comments: Patient injured her right foot and first toe 2 days ago while moving furniture.  She has had persistent pain in the first toe  Patient is a 50 y.o. female presenting with toe pain. The history is provided by the patient.  Toe Pain This is a new problem. The current episode started 2 days ago. The problem occurs constantly. The problem has not changed since onset.The symptoms are aggravated by walking. The symptoms are relieved by nothing. Treatments tried: ice pack, elevation. The treatment provided mild relief.    Past Medical History  Diagnosis Date  . ADD (attention deficit disorder)   . Hypertension     Past Surgical History  Procedure Date  . Cesarean section   . Abdominal hysterectomy   . Cholecystectomy     No family history on file.  History  Substance Use Topics  . Smoking status: Current Everyday Smoker  . Smokeless tobacco: Not on file  . Alcohol Use: No    OB History    Grav Para Term Preterm Abortions TAB SAB Ect Mult Living                  Review of Systems  Constitutional: Negative.   Musculoskeletal: Positive for joint swelling and gait problem.       Complains of pain and swelling distal right first toe    Allergies  Codeine  Home Medications   Current Outpatient Rx  Name Route Sig Dispense Refill  . AMPHETAMINE-DEXTROAMPHETAMINE 30 MG PO CP24 Oral Take 30 mg by mouth every morning.      Marland Kitchen HYDROCODONE-ACETAMINOPHEN 5-500 MG PO TABS Oral Take 1 tablet by mouth at bedtime as needed for pain. 10 tablet 0    BP 146/97  Pulse 88  Temp(Src) 98.7 F (37.1 C) (Oral)  Resp 14  Ht 5\' 8"  (1.727 m)  Wt 199 lb (90.266 kg)  BMI 30.26 kg/m2  SpO2 99%  Physical Exam  Constitutional: She appears well-developed and well-nourished. No  distress.  Musculoskeletal:       Feet:       Tenderness, swelling, and ecchymosis of distal phalanx of right first toe.  Tenderness of first MTP joint and metatarsal.  Toenail appears to have subungual hematoma. Distal Neurovascular function is intact.     ED Course  Procedures  Aspiration of sub-ungual hematoma:  Verbal consent obtained.  With sterile technique, cleansed right first toenail with Betadine and alcohol.  Attempted to burn 1mm hole in nail with electrocautery device unsuccessfully.  Discontinued procedure because of pain.  Labs Reviewed - No data to display Dg Foot Complete Right  10/12/2011  *RADIOLOGY REPORT*  Clinical Data: Great toe pain following injury 2 days ago.  RIGHT FOOT COMPLETE - 3+ VIEW  Comparison: None.  Findings: There appears to be some soft tissue swelling in the plantar aspect of the forefoot, best seen on the lateral view.  No acute fracture, dislocation, bone destruction or foreign body is identified.  There is minimal spurring along the medial aspect of the first metatarsal head.  The first metatarsal phalangeal joint space appears preserved.  IMPRESSION: Plantar forefoot soft tissue swelling.  No acute osseous findings.  Original Report Authenticated By: Gerrianne Scale, M.D.  1. Sprain of foot, right   2. Contusion of great toe of right foot       MDM  Toradol 60mg  IM  Apply ice pack for 20 minutes every 1 to 4 hours.  Continue until swelling decreases.  Take Ibuprofen 200mg , 4 tabs every 8 hours with food.  Begin foot range of motion exercises in about 5 days [facility] information and instruction handout "foot sprain" given). Return if any signs of infection occur:  Increased redness, heat, swelling, etc.        Donna Christen, MD 10/12/11 [phone]

## 2011-10-12 NOTE — ED Notes (Signed)
Patient was moving furniture 2 days ago and injured right great toe. Her toe is painful, edematous and bruised. Pain is constant.  Used ice and tylenol for pain.

## 2012-08-19 ENCOUNTER — Emergency Department
Admission: EM | Admit: 2012-08-19 | Discharge: 2012-08-19 | Disposition: A | Discharge status: Home / Self Care | Payer: BC Managed Care – PPO | Source: Home / Self Care | Attending: Family Medicine | Admitting: Family Medicine

## 2012-08-19 ENCOUNTER — Encounter: Payer: Self-pay | Admitting: Emergency Medicine

## 2012-08-19 ENCOUNTER — Emergency Department (INDEPENDENT_AMBULATORY_CARE_PROVIDER_SITE_OTHER): Payer: BC Managed Care – PPO

## 2012-08-19 DIAGNOSIS — M25539 Pain in unspecified wrist: Secondary | ICD-10-CM

## 2012-08-19 DIAGNOSIS — S63501A Unspecified sprain of right wrist, initial encounter: Secondary | ICD-10-CM

## 2012-08-19 DIAGNOSIS — S63509A Unspecified sprain of unspecified wrist, initial encounter: Secondary | ICD-10-CM

## 2012-08-19 DIAGNOSIS — M7989 Other specified soft tissue disorders: Secondary | ICD-10-CM

## 2012-08-19 DIAGNOSIS — W19XXXA Unspecified fall, initial encounter: Secondary | ICD-10-CM

## 2012-08-19 MED ORDER — OXYCODONE-ACETAMINOPHEN 5-325 MG PO TABS: ORAL_TABLET | ORAL | Status: DC

## 2012-08-19 MED ORDER — KETOROLAC TROMETHAMINE 30 MG/ML IM SOLN
30.0000 mg | Freq: Once | INTRAMUSCULAR | Status: AC
  Administered 2012-08-19: 30 mg via INTRAMUSCULAR

## 2012-08-19 NOTE — ED Provider Notes (Signed)
History     CSN: [id number]  Arrival date & time 08/19/12  1533   First MD Initiated Contact with Patient 08/19/12 1549      Chief Complaint  Patient presents with  . Wrist Pain      HPI Comments: Patient fell in bath tub, injuring her right wrist today.  Patient is a 51 y.o. female presenting with wrist pain. The history is provided by the patient and the spouse.  Wrist Pain This is a new problem. The current episode started less than 1 hour ago. The problem occurs constantly. The problem has not changed since onset.Associated symptoms comments: none. Exacerbated by: movement of right wrist. Treatments tried: ice pack. The treatment provided no relief.    Past Medical History  Diagnosis Date  . ADD (attention deficit disorder)   . Hypertension     Past Surgical History  Procedure Date  . Cesarean section   . Abdominal hysterectomy   . Cholecystectomy     History reviewed. No pertinent family history.  History  Substance Use Topics  . Smoking status: Current Every Day Smoker  . Smokeless tobacco: Not on file  . Alcohol Use: No    OB History    Grav Para Term Preterm Abortions TAB SAB Ect Mult Living                  Review of Systems  All other systems reviewed and are negative.    Allergies  Codeine  Home Medications   Current Outpatient Rx  Name Route Sig Dispense Refill  . AMPHETAMINE-DEXTROAMPHET ER 30 MG PO CP24 Oral Take 30 mg by mouth every morning.      . OXYCODONE-ACETAMINOPHEN 5-325 MG PO TABS  Take one by mouth at bedtime as needed for pain 10 tablet 0    BP 139/80  Pulse 83  Temp 98.6 F (37 C) (Oral)  Resp 18  Ht 5\' 8"  (1.727 m)  Wt 203 lb (92.08 kg)  BMI 30.87 kg/m2  SpO2 97%  Physical Exam  Constitutional: She is oriented to person, place, and time. She appears well-developed and well-nourished. No distress.  HENT:  Head: Atraumatic.  Eyes: Conjunctivae normal are normal. Pupils are equal, round, and reactive to light.    Musculoskeletal: She exhibits tenderness.       Right wrist: She exhibits decreased range of motion, tenderness, bony tenderness and swelling. She exhibits no crepitus, no deformity and no laceration.       Right wrist reveals tenderness and swelling over the distal radius.  There is also snuffbox tenderness present.  Distal Neurovascular function is intact.   Neurological: She is alert and oriented to person, place, and time.  Skin: Skin is warm and dry.    ED Course  Procedures none   Dg Wrist Complete Right  08/19/2012  *RADIOLOGY REPORT*  Clinical Data: Trauma and pain.  RIGHT WRIST - COMPLETE 3+ VIEW  Comparison: None.  Findings: No acute fracture or dislocation.  Scaphoid intact.  IMPRESSION: No acute osseous abnormality.   Original Report Authenticated By: Consuello Bossier, M.D.      1. Right wrist sprain       MDM  Thumb spica wrist splint applied. Elevate arm.  Wear splint.  Apply ice pack for 30 to 45 minutes every 1 to 4 hours.  Continue until swelling decreases.  Take Ibuprofen 200mg , 4 tabs every 8 hours with food. Wrist sprain instructions given [facility]) Followup with Dr. Rodney Langton in four  days.        Lattie Haw, MD 08/19/12 [phone]

## 2012-08-19 NOTE — ED Notes (Signed)
Reports falling in bathtub prior to arrival and hurting right wrist; already some edema; patient is nauseated from pain.

## 2012-08-23 ENCOUNTER — Encounter: Payer: Self-pay | Admitting: Sports Medicine

## 2012-08-23 ENCOUNTER — Ambulatory Visit (INDEPENDENT_AMBULATORY_CARE_PROVIDER_SITE_OTHER): Payer: BC Managed Care – PPO | Admitting: Sports Medicine

## 2012-08-23 VITALS — BP 147/88 | HR 75 | Ht 68.0 in | Wt 206.0 lb

## 2012-08-23 DIAGNOSIS — M25531 Pain in right wrist: Secondary | ICD-10-CM | POA: Insufficient documentation

## 2012-08-23 DIAGNOSIS — M25539 Pain in unspecified wrist: Secondary | ICD-10-CM

## 2012-08-23 MED ORDER — HYDROMORPHONE HCL 2 MG PO TABS: 2.0000 mg | ORAL_TABLET | ORAL | Status: DC | PRN

## 2012-08-23 NOTE — Progress Notes (Signed)
Subjective:    I'm seeing this patient as a consultation for:  Dr. Cathren Harsh  CC: Right wrist pain  HPI: Sonya Daugherty is a pleasant 51 year old female who couple of days ago unfortunately had a fall in the bathtub. She fell onto an outstretched right hand, that resulted in a hyper extension injury. She had immediate pain, swelling, and bruising that she localizes over the distal radius, as well as the anatomic snuffbox. She was seen in urgent care, x-rays were obtained that were negative for acute fracture, she was placed in a thumb spica brace and sent to me for definitive care. Currently, her pain is only slightly better, her Percocet 5/325 mg tablets are only minimally efficacious. She still has significant swelling, and has even more bruising today. She has no numbness, or tingling in her fingers, and has not had any reinjury.  Past medical history, Surgical history, Family history, Social history, Allergies, and medications have been entered into the medical record, reviewed, and no changes needed.   Review of Systems: No headache, visual changes, nausea, vomiting, diarrhea, constipation, dizziness, abdominal pain, skin rash, fevers, chills, night sweats, weight loss, swollen lymph nodes, body aches, joint swelling, muscle aches, chest pain, or shortness of breath.   Objective:   Vitals:  Afebrile, vital signs stable. General: Well Developed, well nourished, and in no acute distress.  Neuro/Psych: Alert and oriented x3, extra-ocular muscles intact, able to move all 4 extremities.  Skin: Warm and dry, no rashes noted.  Respiratory: Not using accessory muscles, speaking in full sentences, trachea midline.  Cardiovascular: Pulses palpable, no extremity edema. Abdomen: Does not appear distended. Right wrist: There significant swelling of the dorsal distal radius, extending into the fingers. There is also significant bruising throughout the wrist. She is exquisite tenderness to palpation over the radial  aspect of the dorsal distal radius. She also has significant tenderness to palpation in the anatomic snuffbox. Her range of motion is approximately 30 of flexion, extension, and 5 of ulnar, and radial deviation. She has full pronation, and supination. I was unable to confirm a Watson's test due to her pain.  Her pain also limited performing the Finkelstein, Phalen, or Tinel's signs.  I did review her x-rays, there is no sign of fracture, dislocation, or degenerative change in the right wrist.  Impression and Recommendations:   This case required medical decision making of moderate complexity.

## 2012-08-23 NOTE — Assessment & Plan Note (Signed)
There is exquisite tenderness over the lateral distal radius, as well as the anatomic snuffbox. We will treat this as a scaphoid fracture, she will continue with the thumb spica splint. She'll come back to see me in 2 weeks, and we'll repeat x-rays. Percocet isn't adequately controlling her pain, I will increase to dilaudid.

## 2012-09-06 ENCOUNTER — Ambulatory Visit (INDEPENDENT_AMBULATORY_CARE_PROVIDER_SITE_OTHER): Payer: BC Managed Care – PPO | Admitting: Sports Medicine

## 2012-09-06 ENCOUNTER — Encounter: Payer: Self-pay | Admitting: Sports Medicine

## 2012-09-06 ENCOUNTER — Ambulatory Visit (INDEPENDENT_AMBULATORY_CARE_PROVIDER_SITE_OTHER): Payer: BC Managed Care – PPO

## 2012-09-06 VITALS — BP 149/85 | HR 77 | Ht 68.0 in | Wt 204.0 lb

## 2012-09-06 DIAGNOSIS — M25539 Pain in unspecified wrist: Secondary | ICD-10-CM

## 2012-09-06 DIAGNOSIS — M25531 Pain in right wrist: Secondary | ICD-10-CM

## 2012-09-06 NOTE — Assessment & Plan Note (Addendum)
X-rays are negative scaphoid is intact on this 2 week followup x-ray, this represents a severe sprain. She will continue her Dilaudid as needed, and I'm going to get her out of the thumb spica brace as requested. We will now start formal physical therapy with several weeks of passive range of motion, before we progressed to AROM and strengthening. She will continue in a simple wrist brace. I will see her back in 3 weeks.

## 2012-09-06 NOTE — Progress Notes (Signed)
Subjective:    CC: Followup right wrist pain  HPI: Sonya Daugherty comes back about 3 weeks after injuring her right wrist. There was initially some suspicion for a scaphoid injury she did have pain in the snuffbox. X-rays were initially negative, and she comes back today for two-week repeat x-rays. Overall her pain is significantly better, bruising is better, swelling is better. She's been in a thumb spica brace. She still gets pain she localizes proximally at the second carpometacarpal joint. She also has pain that she localizes along the first extensor compartment. She denies any current pain in her snuffbox.  Past medical history, Surgical history, Family history, Social history, Allergies, and medications have been entered into the medical record, reviewed, and no changes needed.   Review of Systems: No fevers, chills, night sweats, weight loss, chest pain, or shortness of breath.   Objective:    General: Well Developed, well nourished, and in no acute distress.  Neuro: Alert and oriented x3, extra-ocular muscles intact.  HEENT: Normocephalic, atraumatic, pupils equal round reactive to light. Skin: Warm and dry, no rashes. Respiratory: Not using accessory muscles, speaking in full sentences. Right Wrist: Inspection normal with no erythema, and only minimal swelling. Range of motion is still limited by pain. Palpation is normal over metacarpals, navicular, lunate, and TFCC. She still has tenderness to palpation over the first extensor compartment, with a positive Finkelstein sign. No snuffbox tenderness. No tenderness over Canal of [place]. Negative Watson's test.  I did review x-rays, 3 views of her right wrist are negative for fracture, and should the scaphoid looks intact, and the scapholunate interval is less than 3 mm.  Impression and Recommendations:

## 2012-09-11 ENCOUNTER — Other Ambulatory Visit: Payer: Self-pay | Admitting: Family Medicine

## 2012-09-11 DIAGNOSIS — Z1231 Encounter for screening mammogram for malignant neoplasm of breast: Secondary | ICD-10-CM

## 2012-09-25 ENCOUNTER — Ambulatory Visit: Payer: BC Managed Care – PPO | Admitting: Sports Medicine

## 2012-10-15 ENCOUNTER — Ambulatory Visit
Admission: RE | Admit: 2012-10-15 | Discharge: 2012-10-15 | Disposition: A | Discharge status: Home / Self Care | Payer: BC Managed Care – PPO | Source: Ambulatory Visit | Attending: Family Medicine | Admitting: Family Medicine

## 2012-10-15 DIAGNOSIS — Z1231 Encounter for screening mammogram for malignant neoplasm of breast: Secondary | ICD-10-CM

## 2013-04-24 ENCOUNTER — Encounter: Payer: Self-pay | Admitting: Sports Medicine

## 2013-04-24 ENCOUNTER — Ambulatory Visit (INDEPENDENT_AMBULATORY_CARE_PROVIDER_SITE_OTHER): Payer: BC Managed Care – PPO

## 2013-04-24 ENCOUNTER — Ambulatory Visit (INDEPENDENT_AMBULATORY_CARE_PROVIDER_SITE_OTHER): Payer: BC Managed Care – PPO | Admitting: Sports Medicine

## 2013-04-24 VITALS — BP 149/89 | HR 92 | Wt 209.0 lb

## 2013-04-24 DIAGNOSIS — M1610 Unilateral primary osteoarthritis, unspecified hip: Secondary | ICD-10-CM

## 2013-04-24 DIAGNOSIS — R030 Elevated blood-pressure reading, without diagnosis of hypertension: Secondary | ICD-10-CM

## 2013-04-24 DIAGNOSIS — M169 Osteoarthritis of hip, unspecified: Secondary | ICD-10-CM

## 2013-04-24 DIAGNOSIS — M1612 Unilateral primary osteoarthritis, left hip: Secondary | ICD-10-CM | POA: Insufficient documentation

## 2013-04-24 DIAGNOSIS — I10 Essential (primary) hypertension: Secondary | ICD-10-CM | POA: Insufficient documentation

## 2013-04-24 DIAGNOSIS — E785 Hyperlipidemia, unspecified: Secondary | ICD-10-CM

## 2013-04-24 DIAGNOSIS — B354 Tinea corporis: Secondary | ICD-10-CM

## 2013-04-24 DIAGNOSIS — Z299 Encounter for prophylactic measures, unspecified: Secondary | ICD-10-CM | POA: Insufficient documentation

## 2013-04-24 DIAGNOSIS — M25531 Pain in right wrist: Secondary | ICD-10-CM

## 2013-04-24 DIAGNOSIS — M25559 Pain in unspecified hip: Secondary | ICD-10-CM

## 2013-04-24 LAB — COMPREHENSIVE METABOLIC PANEL WITH GFR
Alkaline Phosphatase: 108 U/L (ref 39–117)
BUN: 12 mg/dL (ref 6–23)
Creat: 0.76 mg/dL (ref 0.50–1.10)
Glucose, Bld: 124 mg/dL — ABNORMAL HIGH (ref 70–99)
Sodium: 141 meq/L (ref 135–145)
Total Bilirubin: 0.4 mg/dL (ref 0.3–1.2)

## 2013-04-24 LAB — CBC
HCT: 43.4 % (ref 36.0–46.0)
Hemoglobin: 14.8 g/dL (ref 12.0–15.0)
MCH: 29 pg (ref 26.0–34.0)
MCHC: 34.1 g/dL (ref 30.0–36.0)
MCV: 84.9 fL (ref 78.0–100.0)
Platelets: 255 10*3/uL (ref 150–400)
RBC: 5.11 MIL/uL (ref 3.87–5.11)
RDW: 13.4 % (ref 11.5–15.5)
WBC: 7 10*3/uL (ref 4.0–10.5)

## 2013-04-24 LAB — COMPREHENSIVE METABOLIC PANEL
ALT: 52 U/L — ABNORMAL HIGH (ref 0–35)
AST: 32 U/L (ref 0–37)
Albumin: 4.6 g/dL (ref 3.5–5.2)
CO2: 26 mEq/L (ref 19–32)
Calcium: 9.5 mg/dL (ref 8.4–10.5)
Chloride: 101 mEq/L (ref 96–112)
Potassium: 4.8 mEq/L (ref 3.5–5.3)
Total Protein: 6.9 g/dL (ref 6.0–8.3)

## 2013-04-24 LAB — HEMOGLOBIN A1C
Hgb A1c MFr Bld: 5.9 % — ABNORMAL HIGH (ref <5.7)
Mean Plasma Glucose: 123 mg/dL — ABNORMAL HIGH (ref <117)

## 2013-04-24 LAB — LIPID PANEL
Cholesterol: 208 mg/dL — ABNORMAL HIGH (ref 0–200)
HDL: 51 mg/dL (ref >39)
LDL Cholesterol: 135 mg/dL — ABNORMAL HIGH (ref 0–99)
Total CHOL/HDL Ratio: 4.1 ratio
Triglycerides: 111 mg/dL (ref <150)
VLDL: 22 mg/dL (ref 0–40)

## 2013-04-24 LAB — TSH: TSH: 1.764 u[IU]/mL (ref 0.350–4.500)

## 2013-04-24 MED ORDER — TERBINAFINE HCL 250 MG PO TABS: 250.0000 mg | ORAL_TABLET | Freq: Every day | ORAL | Status: DC

## 2013-04-24 MED ORDER — MELOXICAM 15 MG PO TABS: ORAL_TABLET | ORAL | Status: DC

## 2013-04-24 MED ORDER — AMPHETAMINE-DEXTROAMPHETAMINE 30 MG PO TABS: 30.0000 mg | ORAL_TABLET | Freq: Every day | ORAL | Status: DC

## 2013-04-24 NOTE — Assessment & Plan Note (Signed)
Resolved

## 2013-04-24 NOTE — Assessment & Plan Note (Signed)
Routine blood work. She will come back for complete physical. Up-to-date on all preventive measures.

## 2013-04-24 NOTE — Assessment & Plan Note (Signed)
Guided injection as above. Formal physical therapy work on hip flexors and severe hip abductor weakness. Mobic. Return in 4 weeks for this. I do not suspect hip labral tear, however pain does not resolve, or continues with mechanical symptoms, we would certainly need to consider an MR arthrogram of the hip.

## 2013-04-24 NOTE — Assessment & Plan Note (Signed)
Present in the axilla, and groin. Lamisil 250 mg daily for 2 weeks. Return as needed for this.

## 2013-04-24 NOTE — Assessment & Plan Note (Signed)
We'll recheck this at the next visit.

## 2013-04-24 NOTE — Progress Notes (Signed)
  Subjective:    CC: Establish care and left groin pain.  HPI: This pleasant 52 year old female was seen several months ago for a wrist sprain, this is completely resolved.  Left groin pain: Occurred months ago when she fell, since then has had pain in her groin is fairly severe but not constant. It does not radiate, localized in the groin but also the deep in the buttock. Worse with hip flexion. Severe.  Left axillary rash: Present for a few months, pruritic.  Preventive measures: Up-to-date on all preventive care, does desires routine blood work.   Past medical history, Surgical history, Family history not pertinant except as noted below, Social history, Allergies, and medications have been entered into the medical record, reviewed, and no changes needed.   Review of Systems: No fevers, chills, night sweats, weight loss, chest pain, or shortness of breath.   Objective:    General: Well Developed, well nourished, and in no acute distress.  Neuro: Alert and oriented x3, extra-ocular muscles intact, sensation grossly intact.  HEENT: Normocephalic, atraumatic, pupils equal round reactive to light, neck supple, no masses, no lymphadenopathy, thyroid nonpalpable.  Skin: Warm and dry,  there is an erythematous with scaly border rash in the left axilla with satellite lesions.  Cardiac: Regular rate and rhythm, no murmurs rubs or gallops, no lower extremity edema.  Respiratory: Clear to auscultation bilaterally. Not using accessory muscles, speaking in full sentences. Left Hip: Exquisite pain with internal rotation, extremely weak hip flexion and abduction. Pelvic alignment unremarkable to inspection and palpation. Standing hip rotation and gait without trendelenburg sign / unsteadiness. Greater trochanter without tenderness to palpation. No tenderness over piriformis and greater trochanter. No pain with FABER or FADIR. No SI joint tenderness and normal minimal SI movement.  Procedure:  Real-time Ultrasound Guided Injection of left femoroacetabular joint Device: GE Logiq E  Ultrasound guided injection is preferred based studies that show increased duration, increased effect, greater accuracy, decreased procedural pain, increased response rate, and decreased cost with ultrasound guided versus blind injection.  Verbal informed consent obtained.  Time-out conducted.  Noted no overlying erythema, induration, or other signs of local infection.  Skin prepped in a sterile fashion.  Local anesthesia: Topical Ethyl chloride.  With sterile technique and under real time ultrasound guidance:  Spinal needle advanced to the femoral head/neck junction, 2 cc Kenalog 40, 4 cc lidocaine injected easily. Completed without difficulty  Pain immediately resolved suggesting accurate placement of the medication.  Advised to call if fevers/chills, erythema, induration, drainage, or persistent bleeding.  Images permanently stored and available for review in the ultrasound unit.  Impression: Technically successful ultrasound guided injection.  Impression and Recommendations:

## 2013-04-25 DIAGNOSIS — E785 Hyperlipidemia, unspecified: Secondary | ICD-10-CM | POA: Insufficient documentation

## 2013-04-25 LAB — VITAMIN D 25 HYDROXY (VIT D DEFICIENCY, FRACTURES): Vit D, 25-Hydroxy: 35 ng/mL (ref 30–89)

## 2013-04-29 ENCOUNTER — Encounter: Payer: Self-pay | Admitting: Sports Medicine

## 2013-04-30 ENCOUNTER — Telehealth: Payer: Self-pay | Admitting: *Deleted

## 2013-04-30 MED ORDER — FLUCONAZOLE 200 MG PO TABS: 200.0000 mg | ORAL_TABLET | ORAL | Status: DC

## 2013-04-30 NOTE — Telephone Encounter (Signed)
Pt started lamisil on Friday and states she broke out in a rash last night on her face, neck and arms. She has stopped the Lamisil and would like to know what she can do for the rash.  Meyer Cory, LPN

## 2013-04-30 NOTE — Telephone Encounter (Signed)
Adding allergy to Lamisil. Calling in Diflucan 200 mg weekly for 4 weeks.

## 2013-04-30 NOTE — Telephone Encounter (Signed)
Pt informed

## 2013-05-07 ENCOUNTER — Encounter: Payer: BC Managed Care – PPO | Admitting: Sports Medicine

## 2013-05-11 ENCOUNTER — Other Ambulatory Visit: Payer: Self-pay | Admitting: Sports Medicine

## 2013-05-13 ENCOUNTER — Telehealth: Payer: Self-pay | Admitting: Sports Medicine

## 2013-05-13 ENCOUNTER — Other Ambulatory Visit: Payer: Self-pay

## 2013-05-13 MED ORDER — AMPHETAMINE-DEXTROAMPHETAMINE 30 MG PO TABS: 30.0000 mg | ORAL_TABLET | Freq: Every day | ORAL | Status: DC

## 2013-05-27 ENCOUNTER — Ambulatory Visit: Payer: BC Managed Care – PPO | Admitting: Sports Medicine

## 2013-07-30 ENCOUNTER — Encounter: Payer: BC Managed Care – PPO | Admitting: Family Medicine

## 2013-07-31 ENCOUNTER — Ambulatory Visit (INDEPENDENT_AMBULATORY_CARE_PROVIDER_SITE_OTHER): Payer: BC Managed Care – PPO | Admitting: Sports Medicine

## 2013-07-31 ENCOUNTER — Encounter: Payer: Self-pay | Admitting: Sports Medicine

## 2013-07-31 VITALS — BP 153/94 | HR 90 | Wt 205.0 lb

## 2013-07-31 DIAGNOSIS — E785 Hyperlipidemia, unspecified: Secondary | ICD-10-CM

## 2013-07-31 DIAGNOSIS — Z0000 Encounter for general adult medical examination without abnormal findings: Secondary | ICD-10-CM

## 2013-07-31 DIAGNOSIS — I10 Essential (primary) hypertension: Secondary | ICD-10-CM

## 2013-07-31 DIAGNOSIS — Z299 Encounter for prophylactic measures, unspecified: Secondary | ICD-10-CM

## 2013-07-31 DIAGNOSIS — F909 Attention-deficit hyperactivity disorder, unspecified type: Secondary | ICD-10-CM

## 2013-07-31 DIAGNOSIS — M169 Osteoarthritis of hip, unspecified: Secondary | ICD-10-CM

## 2013-07-31 DIAGNOSIS — M1610 Unilateral primary osteoarthritis, unspecified hip: Secondary | ICD-10-CM

## 2013-07-31 DIAGNOSIS — M1612 Unilateral primary osteoarthritis, left hip: Secondary | ICD-10-CM

## 2013-07-31 DIAGNOSIS — R55 Syncope and collapse: Secondary | ICD-10-CM

## 2013-07-31 MED ORDER — AMPHETAMINE-DEXTROAMPHETAMINE 30 MG PO TABS: 30.0000 mg | ORAL_TABLET | Freq: Every day | ORAL | Status: DC

## 2013-07-31 MED ORDER — AMPHETAMINE-DEXTROAMPHETAMINE 30 MG PO TABS: 30.0000 mg | ORAL_TABLET | Freq: Two times a day (BID) | ORAL | Status: DC

## 2013-07-31 NOTE — Assessment & Plan Note (Signed)
Today, she will get her flu shot at work this month.

## 2013-07-31 NOTE — Progress Notes (Signed)
  Subjective:    CC: CPE  HPI:  Here for complete physical, this will be dictated below.  Hypertension: Stopped taking her blood pressure medicine due to sweating. Blood pressure is elevated, no headaches, visual changes, Chest pain.  Hyperlipidemia: Was mild, currently working on dietary modification.  Adult ADHD: Needs refill on her Adderall.  Vasomotor instability: Like to continue using herbal supplements, she is postmenopausal.  Past medical history, Surgical history, Family history not pertinant except as noted below, Social history, Allergies, and medications have been entered into the medical record, reviewed, and no changes needed.   Review of Systems: No headache, visual changes, nausea, vomiting, diarrhea, constipation, dizziness, abdominal pain, skin rash, fevers, chills, night sweats, swollen lymph nodes, weight loss, chest pain, body aches, joint swelling, muscle aches, shortness of breath, mood changes, visual or auditory hallucinations.  Objective:    General: Well Developed, well nourished, and in no acute distress.  Neuro: Alert and oriented x3, extra-ocular muscles intact, sensation grossly intact.  HEENT: Normocephalic, atraumatic, pupils equal round reactive to light, neck supple, no masses, no lymphadenopathy, thyroid nonpalpable.  Skin: Warm and dry, no rashes noted.  Cardiac: Regular rate and rhythm, no murmurs rubs or gallops.  Respiratory: Clear to auscultation bilaterally. Not using accessory muscles, speaking in full sentences.  Abdominal: Soft, nontender, nondistended, positive bowel sounds, no masses, no organomegaly.  Musculoskeletal: Shoulder, elbow, wrist, hip, knee, ankle stable, and with full range of motion.  Impression and Recommendations:    The patient was counselled, risk factors were discussed, anticipatory guidance given.

## 2013-07-31 NOTE — Assessment & Plan Note (Signed)
Perimenopausal. She's tried some herbal supplements. We did discuss hormonal replacement however she does have a family history of breast cancer so this is not likely a good choice. We could also consider SSRIs and gabapentin in the future, she will think about this.

## 2013-07-31 NOTE — Assessment & Plan Note (Signed)
With only a single risk factor, LDL is at an adequate level.

## 2013-07-31 NOTE — Assessment & Plan Note (Signed)
Resolved after a guided injection to the left hip joint 3 months ago.

## 2013-07-31 NOTE — Assessment & Plan Note (Signed)
I will refill her Adderall for one month. She needs to come back to see me in 2 weeks to recheck blood pressure, we will not continue refill Adderall if her blood pressure continues to be uncontrolled and she refuses to take medication.

## 2013-07-31 NOTE — Assessment & Plan Note (Signed)
Elevated today but no clinical signs of end organ damage. She will restart her blood pressure medicine and return in 2 weeks to recheck. I did advise her that I would not continue to prescribe Adderall unless her blood pressure was under control.

## 2013-08-14 ENCOUNTER — Ambulatory Visit (INDEPENDENT_AMBULATORY_CARE_PROVIDER_SITE_OTHER): Payer: BC Managed Care – PPO | Admitting: Sports Medicine

## 2013-08-14 ENCOUNTER — Encounter: Payer: Self-pay | Admitting: Sports Medicine

## 2013-08-14 VITALS — BP 126/84 | HR 86 | Wt 202.0 lb

## 2013-08-14 DIAGNOSIS — F909 Attention-deficit hyperactivity disorder, unspecified type: Secondary | ICD-10-CM

## 2013-08-14 DIAGNOSIS — E785 Hyperlipidemia, unspecified: Secondary | ICD-10-CM

## 2013-08-14 DIAGNOSIS — R55 Syncope and collapse: Secondary | ICD-10-CM

## 2013-08-14 DIAGNOSIS — Z299 Encounter for prophylactic measures, unspecified: Secondary | ICD-10-CM

## 2013-08-14 DIAGNOSIS — I10 Essential (primary) hypertension: Secondary | ICD-10-CM

## 2013-08-14 NOTE — Progress Notes (Signed)
  Subjective:    CC: Followup  HPI: Hypertension: Well controlled. Stable.  ADHD: Well-controlled. No refills needed. Stable.  Vasomotor instability: Fairly well-controlled with over-the-counter herbals, does not desire to pursue pharmacologic treatment at this time. Stable.  Hyperlipidemia: Continues to work on lifestyle and dietary modifications. Stable.  Past medical history, Surgical history, Family history not pertinant except as noted below, Social history, Allergies, and medications have been entered into the medical record, reviewed, and no changes needed.   Review of Systems: No fevers, chills, night sweats, weight loss, chest pain, or shortness of breath.   Objective:    General: Well Developed, well nourished, and in no acute distress.  Neuro: Alert and oriented x3, extra-ocular muscles intact, sensation grossly intact.  HEENT: Normocephalic, atraumatic, pupils equal round reactive to light, neck supple, no masses, no lymphadenopathy, thyroid nonpalpable.  Skin: Warm and dry, no rashes. Cardiac: Regular rate and rhythm, no murmurs rubs or gallops, no lower extremity edema.  Respiratory: Clear to auscultation bilaterally. Not using accessory muscles, speaking in full sentences.   Impression and Recommendations:

## 2013-08-14 NOTE — Assessment & Plan Note (Signed)
Stable, does not need a refill until sometime next month.

## 2013-08-14 NOTE — Assessment & Plan Note (Signed)
Currently working on lifestyle modifications. We can recheck lipids in about 2 months.

## 2013-08-14 NOTE — Assessment & Plan Note (Signed)
Stable, tolerable with over-the-counter herbal supplements. Certainly the future if intolerable we can consider SSRIs or gabapentin, I would avoid hormonal replacement with her history.

## 2013-08-14 NOTE — Assessment & Plan Note (Signed)
Well controlled, no changes 

## 2013-08-14 NOTE — Assessment & Plan Note (Signed)
Up-to-date on all preventive measures. 

## 2013-08-27 ENCOUNTER — Other Ambulatory Visit: Payer: Self-pay | Admitting: Sports Medicine

## 2013-08-27 MED ORDER — AMPHETAMINE-DEXTROAMPHETAMINE 30 MG PO TABS: 30.0000 mg | ORAL_TABLET | Freq: Two times a day (BID) | ORAL | Status: DC

## 2013-08-27 NOTE — Telephone Encounter (Addendum)
Pt request a refill for adderall, lorazepam, and hyzaar. Her adderall is not due until 10/7. The lorazepam we have never filled for her before and it looks like we have not filled the hyzaar either since its listed as historical medication and there are no directions. Iet the pt know that I would send the request for Dr. Karie Schwalbe to review

## 2013-09-03 ENCOUNTER — Other Ambulatory Visit: Payer: Self-pay | Admitting: *Deleted

## 2013-09-03 MED ORDER — LOSARTAN POTASSIUM-HCTZ 100-25 MG PO TABS: 1.0000 | ORAL_TABLET | Freq: Every day | ORAL | Status: DC

## 2013-09-27 ENCOUNTER — Other Ambulatory Visit: Payer: Self-pay | Admitting: Sports Medicine

## 2013-09-27 MED ORDER — AMPHETAMINE-DEXTROAMPHETAMINE 30 MG PO TABS: 30.0000 mg | ORAL_TABLET | Freq: Two times a day (BID) | ORAL | Status: DC

## 2013-09-27 NOTE — Telephone Encounter (Signed)
Patient request refill for Adderal.  Bjorn Loser Cunningham,CMA

## 2013-10-26 ENCOUNTER — Other Ambulatory Visit: Payer: Self-pay | Admitting: Sports Medicine

## 2013-10-28 ENCOUNTER — Other Ambulatory Visit: Payer: Self-pay

## 2013-10-28 MED ORDER — AMPHETAMINE-DEXTROAMPHETAMINE 30 MG PO TABS: 30.0000 mg | ORAL_TABLET | Freq: Two times a day (BID) | ORAL | Status: DC

## 2013-10-28 NOTE — Telephone Encounter (Signed)
Refill request for Adderall. Rhonda Cunningham,CMA  

## 2013-10-29 ENCOUNTER — Other Ambulatory Visit: Payer: Self-pay

## 2013-12-18 ENCOUNTER — Other Ambulatory Visit: Payer: Self-pay

## 2013-12-18 DIAGNOSIS — Z1231 Encounter for screening mammogram for malignant neoplasm of breast: Secondary | ICD-10-CM

## 2014-01-08 ENCOUNTER — Ambulatory Visit
Admission: RE | Admit: 2014-01-08 | Discharge: 2014-01-08 | Disposition: A | Discharge status: Home / Self Care | Payer: BC Managed Care – PPO | Source: Ambulatory Visit

## 2014-01-08 DIAGNOSIS — Z1231 Encounter for screening mammogram for malignant neoplasm of breast: Secondary | ICD-10-CM

## 2015-08-05 ENCOUNTER — Other Ambulatory Visit: Payer: Self-pay

## 2015-08-05 DIAGNOSIS — Z1231 Encounter for screening mammogram for malignant neoplasm of breast: Secondary | ICD-10-CM

## 2015-08-18 ENCOUNTER — Ambulatory Visit
Admission: RE | Admit: 2015-08-18 | Discharge: 2015-08-18 | Disposition: A | Discharge status: Home / Self Care | Payer: PRIVATE HEALTH INSURANCE | Source: Ambulatory Visit

## 2015-08-18 DIAGNOSIS — Z1231 Encounter for screening mammogram for malignant neoplasm of breast: Secondary | ICD-10-CM

## 2016-09-22 ENCOUNTER — Other Ambulatory Visit: Payer: Self-pay | Admitting: Family Medicine

## 2016-09-22 DIAGNOSIS — Z1231 Encounter for screening mammogram for malignant neoplasm of breast: Secondary | ICD-10-CM

## 2016-10-26 ENCOUNTER — Ambulatory Visit
Admission: RE | Admit: 2016-10-26 | Discharge: 2016-10-26 | Disposition: A | Discharge status: Home / Self Care | Payer: PRIVATE HEALTH INSURANCE | Source: Ambulatory Visit | Attending: Family Medicine | Admitting: Family Medicine

## 2016-10-26 DIAGNOSIS — Z1231 Encounter for screening mammogram for malignant neoplasm of breast: Secondary | ICD-10-CM

## 2017-12-20 ENCOUNTER — Other Ambulatory Visit: Payer: Self-pay | Admitting: Family Medicine

## 2017-12-20 DIAGNOSIS — Z1231 Encounter for screening mammogram for malignant neoplasm of breast: Secondary | ICD-10-CM

## 2018-01-08 ENCOUNTER — Ambulatory Visit
Admission: RE | Admit: 2018-01-08 | Discharge: 2018-01-08 | Disposition: A | Discharge status: Home / Self Care | Payer: PRIVATE HEALTH INSURANCE | Source: Ambulatory Visit | Attending: Family Medicine | Admitting: Family Medicine

## 2018-01-08 DIAGNOSIS — Z1231 Encounter for screening mammogram for malignant neoplasm of breast: Secondary | ICD-10-CM

## 2021-08-11 ENCOUNTER — Emergency Department (HOSPITAL_BASED_OUTPATIENT_CLINIC_OR_DEPARTMENT_OTHER): Payer: BLUE CROSS/BLUE SHIELD

## 2021-08-11 ENCOUNTER — Emergency Department (HOSPITAL_BASED_OUTPATIENT_CLINIC_OR_DEPARTMENT_OTHER): Payer: BLUE CROSS/BLUE SHIELD | Admitting: Radiology

## 2021-08-11 ENCOUNTER — Other Ambulatory Visit: Payer: Self-pay

## 2021-08-11 ENCOUNTER — Encounter (HOSPITAL_BASED_OUTPATIENT_CLINIC_OR_DEPARTMENT_OTHER): Payer: Self-pay | Admitting: Radiology

## 2021-08-11 ENCOUNTER — Inpatient Hospital Stay (HOSPITAL_BASED_OUTPATIENT_CLINIC_OR_DEPARTMENT_OTHER)
Admission: EM | Admit: 2021-08-11 | Discharge: 2021-08-17 | DRG: 177 | Disposition: A | Discharge status: Home / Self Care | Payer: BLUE CROSS/BLUE SHIELD | Attending: Internal Medicine | Admitting: Internal Medicine

## 2021-08-11 ENCOUNTER — Encounter (HOSPITAL_BASED_OUTPATIENT_CLINIC_OR_DEPARTMENT_OTHER): Payer: Self-pay | Admitting: Obstetrics and Gynecology

## 2021-08-11 DIAGNOSIS — E119 Type 2 diabetes mellitus without complications: Secondary | ICD-10-CM

## 2021-08-11 DIAGNOSIS — E1165 Type 2 diabetes mellitus with hyperglycemia: Secondary | ICD-10-CM | POA: Diagnosis present

## 2021-08-11 DIAGNOSIS — I10 Essential (primary) hypertension: Secondary | ICD-10-CM | POA: Diagnosis present

## 2021-08-11 DIAGNOSIS — R0600 Dyspnea, unspecified: Secondary | ICD-10-CM

## 2021-08-11 DIAGNOSIS — C8590 Non-Hodgkin lymphoma, unspecified, unspecified site: Secondary | ICD-10-CM

## 2021-08-11 DIAGNOSIS — Z79899 Other long term (current) drug therapy: Secondary | ICD-10-CM

## 2021-08-11 DIAGNOSIS — D849 Immunodeficiency, unspecified: Secondary | ICD-10-CM | POA: Diagnosis present

## 2021-08-11 DIAGNOSIS — Z885 Allergy status to narcotic agent status: Secondary | ICD-10-CM

## 2021-08-11 DIAGNOSIS — U071 COVID-19: Principal | ICD-10-CM

## 2021-08-11 DIAGNOSIS — Z8249 Family history of ischemic heart disease and other diseases of the circulatory system: Secondary | ICD-10-CM

## 2021-08-11 DIAGNOSIS — R0609 Other forms of dyspnea: Secondary | ICD-10-CM

## 2021-08-11 DIAGNOSIS — E876 Hypokalemia: Secondary | ICD-10-CM

## 2021-08-11 DIAGNOSIS — Z87891 Personal history of nicotine dependence: Secondary | ICD-10-CM

## 2021-08-11 DIAGNOSIS — J151 Pneumonia due to Pseudomonas: Secondary | ICD-10-CM | POA: Diagnosis present

## 2021-08-11 DIAGNOSIS — F988 Other specified behavioral and emotional disorders with onset usually occurring in childhood and adolescence: Secondary | ICD-10-CM | POA: Diagnosis present

## 2021-08-11 DIAGNOSIS — R6520 Severe sepsis without septic shock: Secondary | ICD-10-CM | POA: Diagnosis not present

## 2021-08-11 DIAGNOSIS — E860 Dehydration: Secondary | ICD-10-CM | POA: Diagnosis present

## 2021-08-11 DIAGNOSIS — A4152 Sepsis due to Pseudomonas: Secondary | ICD-10-CM | POA: Diagnosis not present

## 2021-08-11 DIAGNOSIS — D6489 Other specified anemias: Secondary | ICD-10-CM | POA: Diagnosis present

## 2021-08-11 DIAGNOSIS — Z91041 Radiographic dye allergy status: Secondary | ICD-10-CM

## 2021-08-11 DIAGNOSIS — R680 Hypothermia, not associated with low environmental temperature: Secondary | ICD-10-CM | POA: Diagnosis not present

## 2021-08-11 DIAGNOSIS — Z888 Allergy status to other drugs, medicaments and biological substances status: Secondary | ICD-10-CM

## 2021-08-11 DIAGNOSIS — Z833 Family history of diabetes mellitus: Secondary | ICD-10-CM

## 2021-08-11 DIAGNOSIS — D696 Thrombocytopenia, unspecified: Secondary | ICD-10-CM | POA: Diagnosis present

## 2021-08-11 DIAGNOSIS — D638 Anemia in other chronic diseases classified elsewhere: Secondary | ICD-10-CM | POA: Diagnosis present

## 2021-08-11 DIAGNOSIS — J159 Unspecified bacterial pneumonia: Secondary | ICD-10-CM | POA: Diagnosis present

## 2021-08-11 DIAGNOSIS — J1282 Pneumonia due to coronavirus disease 2019: Secondary | ICD-10-CM | POA: Diagnosis present

## 2021-08-11 DIAGNOSIS — Z791 Long term (current) use of non-steroidal anti-inflammatories (NSAID): Secondary | ICD-10-CM

## 2021-08-11 DIAGNOSIS — R739 Hyperglycemia, unspecified: Secondary | ICD-10-CM

## 2021-08-11 DIAGNOSIS — E785 Hyperlipidemia, unspecified: Secondary | ICD-10-CM | POA: Diagnosis present

## 2021-08-11 DIAGNOSIS — R627 Adult failure to thrive: Secondary | ICD-10-CM | POA: Diagnosis present

## 2021-08-11 DIAGNOSIS — G9341 Metabolic encephalopathy: Secondary | ICD-10-CM | POA: Diagnosis present

## 2021-08-11 DIAGNOSIS — Z28310 Unvaccinated for covid-19: Secondary | ICD-10-CM

## 2021-08-11 DIAGNOSIS — R579 Shock, unspecified: Secondary | ICD-10-CM | POA: Diagnosis present

## 2021-08-11 HISTORY — DX: Type 2 diabetes mellitus without complications: E11.9

## 2021-08-11 LAB — I-STAT VENOUS BLOOD GAS, ED
Acid-Base Excess: 11 mmol/L — ABNORMAL HIGH (ref 0.0–2.0)
Bicarbonate: 35.6 mmol/L — ABNORMAL HIGH (ref 20.0–28.0)
Calcium, Ion: 1.08 mmol/L — ABNORMAL LOW (ref 1.15–1.40)
HCT: 33 % — ABNORMAL LOW (ref 36.0–46.0)
Hemoglobin: 11.2 g/dL — ABNORMAL LOW (ref 12.0–15.0)
O2 Saturation: 26 %
Potassium: 2.8 mmol/L — ABNORMAL LOW (ref 3.5–5.1)
Sodium: 128 mmol/L — ABNORMAL LOW (ref 135–145)
TCO2: 37 mmol/L — ABNORMAL HIGH (ref 22–32)
pCO2, Ven: 48.7 mmHg (ref 44.0–60.0)
pH, Ven: 7.471 — ABNORMAL HIGH (ref 7.250–7.430)
pO2, Ven: 17 mmHg — CL (ref 32.0–45.0)

## 2021-08-11 LAB — BASIC METABOLIC PANEL
Anion gap: 12 (ref 5–15)
BUN: 10 mg/dL (ref 6–20)
CO2: 31 mmol/L (ref 22–32)
Calcium: 8.7 mg/dL — ABNORMAL LOW (ref 8.9–10.3)
Chloride: 85 mmol/L — ABNORMAL LOW (ref 98–111)
Creatinine, Ser: 0.61 mg/dL (ref 0.44–1.00)
GFR, Estimated: >60 mL/min (ref >60)
Glucose, Bld: 416 mg/dL — ABNORMAL HIGH (ref 70–99)
Potassium: 2.9 mmol/L — ABNORMAL LOW (ref 3.5–5.1)
Sodium: 128 mmol/L — ABNORMAL LOW (ref 135–145)

## 2021-08-11 LAB — CBG MONITORING, ED
Glucose-Capillary: 428 mg/dL — ABNORMAL HIGH (ref 70–99)
Glucose-Capillary: 248 mg/dL — ABNORMAL HIGH (ref 70–99)

## 2021-08-11 LAB — URINALYSIS, ROUTINE W REFLEX MICROSCOPIC
Bilirubin Urine: NEGATIVE
Glucose, UA: 100 mg/dL — AB
Hgb urine dipstick: NEGATIVE
Ketones, ur: NEGATIVE mg/dL
Nitrite: NEGATIVE
Protein, ur: NEGATIVE mg/dL
Specific Gravity, Urine: <1.005 — ABNORMAL LOW (ref 1.005–1.030)
pH: 6.5 (ref 5.0–8.0)

## 2021-08-11 LAB — RESP PANEL BY RT-PCR (FLU A&B, COVID) ARPGX2
Influenza A by PCR: NEGATIVE
Influenza B by PCR: NEGATIVE
SARS Coronavirus 2 by RT PCR: POSITIVE — AB

## 2021-08-11 LAB — CBC
HCT: 34.6 % — ABNORMAL LOW (ref 36.0–46.0)
Hemoglobin: 11.7 g/dL — ABNORMAL LOW (ref 12.0–15.0)
MCH: 29.9 pg (ref 26.0–34.0)
MCHC: 33.8 g/dL (ref 30.0–36.0)
MCV: 88.5 fL (ref 80.0–100.0)
Platelets: 267 10*3/uL (ref 150–400)
RBC: 3.91 MIL/uL (ref 3.87–5.11)
RDW: 13.2 % (ref 11.5–15.5)
WBC: 11.7 10*3/uL — ABNORMAL HIGH (ref 4.0–10.5)
nRBC: 0 % (ref 0.0–0.2)

## 2021-08-11 LAB — LACTATE DEHYDROGENASE: LDH: 157 U/L (ref 98–192)

## 2021-08-11 LAB — HEPATIC FUNCTION PANEL
ALT: 11 U/L (ref 0–44)
AST: 12 U/L — ABNORMAL LOW (ref 15–41)
Albumin: 3.5 g/dL (ref 3.5–5.0)
Alkaline Phosphatase: 306 U/L — ABNORMAL HIGH (ref 38–126)
Bilirubin, Direct: 0.4 mg/dL — ABNORMAL HIGH (ref 0.0–0.2)
Indirect Bilirubin: 0.8 mg/dL (ref 0.3–0.9)
Total Bilirubin: 1.2 mg/dL (ref 0.3–1.2)
Total Protein: 6 g/dL — ABNORMAL LOW (ref 6.5–8.1)

## 2021-08-11 LAB — ETHANOL: Alcohol, Ethyl (B): <10 mg/dL (ref <10)

## 2021-08-11 LAB — URIC ACID: Uric Acid, Serum: 2.4 mg/dL — ABNORMAL LOW (ref 2.5–7.1)

## 2021-08-11 LAB — TSH: TSH: 0.553 u[IU]/mL (ref 0.350–4.500)

## 2021-08-11 LAB — MAGNESIUM: Magnesium: 1.5 mg/dL — ABNORMAL LOW (ref 1.7–2.4)

## 2021-08-11 LAB — AMMONIA: Ammonia: 30 umol/L (ref 9–35)

## 2021-08-11 LAB — BETA-HYDROXYBUTYRIC ACID: Beta-Hydroxybutyric Acid: 0.07 mmol/L (ref 0.05–0.27)

## 2021-08-11 MED ORDER — INSULIN ASPART 100 UNIT/ML IJ SOLN
0.0000 [IU] | Freq: Every day | INTRAMUSCULAR | Status: DC
Start: 2021-08-11 — End: 2021-08-12
  Administered 2021-08-12: 2 [IU] via SUBCUTANEOUS

## 2021-08-11 MED ORDER — MAGNESIUM OXIDE -MG SUPPLEMENT 400 (240 MG) MG PO TABS
400.0000 mg | ORAL_TABLET | Freq: Once | ORAL | Status: AC
  Administered 2021-08-11: 400 mg via ORAL
  Filled 2021-08-11: qty 1

## 2021-08-11 MED ORDER — ENOXAPARIN SODIUM 60 MG/0.6ML IJ SOSY
60.0000 mg | PREFILLED_SYRINGE | Freq: Two times a day (BID) | INTRAMUSCULAR | Status: DC
  Administered 2021-08-12 (×2): 60 mg via SUBCUTANEOUS
  Filled 2021-08-11 (×2): qty 0.6

## 2021-08-11 MED ORDER — GUAIFENESIN-DM 100-10 MG/5ML PO SYRP
10.0000 mL | ORAL_SOLUTION | ORAL | Status: DC | PRN
  Administered 2021-08-13: 10 mL via ORAL
  Filled 2021-08-11: qty 10

## 2021-08-11 MED ORDER — CHLORHEXIDINE GLUCONATE CLOTH 2 % EX PADS
6.0000 | MEDICATED_PAD | Freq: Every day | CUTANEOUS | Status: DC
  Administered 2021-08-12 – 2021-08-16 (×5): 6 via TOPICAL

## 2021-08-11 MED ORDER — LORAZEPAM 0.5 MG PO TABS
0.5000 mg | ORAL_TABLET | Freq: Four times a day (QID) | ORAL | Status: DC | PRN
  Administered 2021-08-12 – 2021-08-15 (×2): 0.5 mg via ORAL
  Filled 2021-08-11 (×2): qty 1

## 2021-08-11 MED ORDER — IOHEXOL 350 MG/ML SOLN: 75.0000 mL | Freq: Once | INTRAVENOUS | Status: DC | PRN

## 2021-08-11 MED ORDER — POTASSIUM CHLORIDE 10 MEQ/100ML IV SOLN
10.0000 meq | INTRAVENOUS | Status: AC
Start: 2021-08-11 — End: 2021-08-11
  Administered 2021-08-11 (×3): 10 meq via INTRAVENOUS
  Filled 2021-08-11 (×3): qty 100

## 2021-08-11 MED ORDER — LACTATED RINGERS IV SOLN: INTRAVENOUS | Status: DC

## 2021-08-11 MED ORDER — LOSARTAN POTASSIUM-HCTZ 100-25 MG PO TABS
1.0000 | ORAL_TABLET | Freq: Every day | ORAL | Status: DC
Start: 2021-08-12 — End: 2021-08-12

## 2021-08-11 MED ORDER — MAGNESIUM SULFATE 2 GM/50ML IV SOLN
2.0000 g | Freq: Once | INTRAVENOUS | Status: AC
  Administered 2021-08-11: 2 g via INTRAVENOUS
  Filled 2021-08-11: qty 50

## 2021-08-11 MED ORDER — SODIUM CHLORIDE 0.9% FLUSH: 10.0000 mL | INTRAVENOUS | Status: DC | PRN

## 2021-08-11 MED ORDER — INSULIN ASPART 100 UNIT/ML IJ SOLN
0.0000 [IU] | Freq: Three times a day (TID) | INTRAMUSCULAR | Status: DC
  Administered 2021-08-12: 9 [IU] via SUBCUTANEOUS
  Administered 2021-08-12: 7 [IU] via SUBCUTANEOUS

## 2021-08-11 MED ORDER — ADULT MULTIVITAMIN W/MINERALS CH
1.0000 | ORAL_TABLET | Freq: Every day | ORAL | Status: DC
  Administered 2021-08-12 – 2021-08-16 (×4): 1 via ORAL
  Filled 2021-08-11 (×5): qty 1

## 2021-08-11 MED ORDER — HYDROCORTISONE SOD SUC (PF) 250 MG IJ SOLR
200.0000 mg | Freq: Once | INTRAMUSCULAR | Status: AC
  Administered 2021-08-12: 200 mg via INTRAVENOUS
  Filled 2021-08-11 (×2): qty 200

## 2021-08-11 MED ORDER — TRIMETHOBENZAMIDE HCL 100 MG/ML IM SOLN
200.0000 mg | Freq: Three times a day (TID) | INTRAMUSCULAR | Status: DC | PRN
  Filled 2021-08-11: qty 2

## 2021-08-11 MED ORDER — DIPHENHYDRAMINE HCL 50 MG/ML IJ SOLN
50.0000 mg | Freq: Once | INTRAMUSCULAR | Status: AC
  Administered 2021-08-12: 50 mg via INTRAVENOUS
  Filled 2021-08-11: qty 1

## 2021-08-11 MED ORDER — ALBUTEROL SULFATE HFA 108 (90 BASE) MCG/ACT IN AERS
2.0000 | INHALATION_SPRAY | Freq: Four times a day (QID) | RESPIRATORY_TRACT | Status: DC
  Administered 2021-08-12 – 2021-08-17 (×17): 2 via RESPIRATORY_TRACT
  Filled 2021-08-11: qty 6.7

## 2021-08-11 MED ORDER — MAGNESIUM SULFATE 50 % IJ SOLN
2.0000 g | Freq: Once | INTRAMUSCULAR | Status: DC
  Filled 2021-08-11: qty 4

## 2021-08-11 MED ORDER — DIPHENHYDRAMINE HCL 25 MG PO CAPS: 50.0000 mg | ORAL_CAPSULE | Freq: Once | ORAL | Status: AC

## 2021-08-11 MED ORDER — POTASSIUM CHLORIDE CRYS ER 20 MEQ PO TBCR
40.0000 meq | EXTENDED_RELEASE_TABLET | Freq: Once | ORAL | Status: AC
  Administered 2021-08-11: 40 meq via ORAL
  Filled 2021-08-11: qty 2

## 2021-08-11 MED ORDER — ACETAMINOPHEN 325 MG PO TABS
650.0000 mg | ORAL_TABLET | Freq: Four times a day (QID) | ORAL | Status: DC | PRN
  Administered 2021-08-12 (×2): 650 mg via ORAL
  Filled 2021-08-11 (×2): qty 2

## 2021-08-11 MED ORDER — NIRMATRELVIR/RITONAVIR (PAXLOVID)TABLET
3.0000 | ORAL_TABLET | Freq: Two times a day (BID) | ORAL | Status: DC
  Administered 2021-08-11: 3 via ORAL
  Filled 2021-08-11: qty 30

## 2021-08-11 MED ORDER — SENNOSIDES-DOCUSATE SODIUM 8.6-50 MG PO TABS
1.0000 | ORAL_TABLET | Freq: Every evening | ORAL | Status: DC | PRN
  Administered 2021-08-12: 1 via ORAL
  Filled 2021-08-11: qty 1

## 2021-08-11 MED ORDER — LACTATED RINGERS IV BOLUS
1000.0000 mL | Freq: Once | INTRAVENOUS | Status: AC
  Administered 2021-08-11: 1000 mL via INTRAVENOUS

## 2021-08-11 MED ORDER — ACETAMINOPHEN 500 MG PO TABS
1000.0000 mg | ORAL_TABLET | Freq: Once | ORAL | Status: AC
  Administered 2021-08-11: 1000 mg via ORAL
  Filled 2021-08-11: qty 2

## 2021-08-11 NOTE — ED Notes (Signed)
Called [facility] to transport patient to [place]

## 2021-08-11 NOTE — ED Provider Notes (Signed)
[facility] EMERGENCY DEPT Provider Note   CSN: [id number] Arrival date & time: 08/11/21  1443     History Chief Complaint  Patient presents with   Hyperglycemia    Sonya Daugherty is a 60 y.o. female.   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  History is provided by the patient states that she has had for the past month a dull frontal headache that is nonradiating.  She currently describes it as 3 out of 10 in severity.  It is waxed and waned over the past month.  Over the past 3 days she has developed nausea and vomiting and decreased oral intake.  Nothing makes her nausea and vomiting better or worse.  She checked her blood sugar at home and it was found to be 395.  She has had decreased oral intake over the past couple of days and feels generalized weakness and fatigue.  Past Medical History:  Diagnosis Date   ADD (attention deficit disorder)    Diabetes mellitus without complication ([place])    Hypertension     Patient Active Problem List   Diagnosis Date Noted   Adult ADHD 07/31/2013   Vasomotor instability 07/31/2013   Hyperlipidemia 04/25/2013   Osteoarthritis of left hip 04/24/2013   Preventive measure 04/24/2013   Essential hypertension, benign 04/24/2013   Tinea corporis 04/24/2013    Past Surgical History:  Procedure Laterality Date   ABDOMINAL HYSTERECTOMY     CESAREAN SECTION     CHOLECYSTECTOMY       OB History     Gravida      Para      Term      Preterm      AB      Living  3      SAB      IAB      Ectopic      Multiple      Live Births               Family History  Problem Relation Age of Onset   Diabetes Mother    Heart disease Father    Heart disease Brother    Ataxia Neg Hx     Social History   Tobacco Use   Smoking status: Former    Types: Cigarettes    Quit date: 11/21/2018    Years since quitting: 2.7   Smokeless tobacco: Never  Vaping Use   Vaping Use: Never used  Substance Use Topics   Alcohol use: Yes    Alcohol/week: 2.0 standard drinks    Types: 2 Shots of liquor per week   Drug use: No    Home Medications Prior to Admission medications   Medication Sig Start Date End Date Taking? Authorizing Provider  amphetamine-dextroamphetamine (ADDERALL) 30 MG tablet Take 1 tablet (30 mg total) by mouth 2 (two) times daily. 10/28/13 10/28/14  Silverio Decamp, MD  LORazepam (ATIVAN) 0.5 MG tablet Take 0.5 mg by mouth every 6 (six) hours as needed for anxiety.    [provider]  losartan-hydrochlorothiazide (HYZAAR) 100-25 MG per tablet Take 1 tablet by mouth daily. 09/03/13   Silverio Decamp, MD  meloxicam (MOBIC) 15 MG tablet One tab PO qAM with breakfast for 2 weeks, then daily prn pain. 04/24/13   Silverio Decamp, MD  Multiple Vitamins-Minerals (MULTIVITAMIN PO) Take by mouth daily.    [provider]  vitamin B-12 (CYANOCOBALAMIN) 500 MCG tablet Take 500 mcg by mouth daily.    [provider]    Allergies    Codeine and Lamisil [terbinafine]  Review of Systems   Review of Systems  Constitutional:  Negative for chills and fever.  HENT:  Negative for ear pain and sore throat.   Eyes:  Negative for pain and visual disturbance.  Respiratory:  Negative for cough and shortness of breath.   Cardiovascular:  Negative for chest pain and palpitations.  Gastrointestinal:  Positive for nausea and vomiting. Negative for abdominal pain.  Genitourinary:  Negative for dysuria and hematuria.  Musculoskeletal:  Negative for arthralgias and back pain.  Skin:  Negative for color  change and rash.  Neurological:  Positive for headaches. Negative for seizures, syncope, facial asymmetry, speech difficulty, weakness and numbness.  All other systems reviewed and are negative.  Physical Exam Updated Vital Signs BP 132/80   Pulse (!) 105   Temp 99.6 F (37.6 C) (Oral)   Resp (!) 28   SpO2 98%   Physical Exam Vitals and nursing note reviewed.  Constitutional:      General: She is not in acute distress.    Comments: Somnolent, appears fatigued  HENT:     Head: Normocephalic and atraumatic.  Eyes:     Conjunctiva/sclera: Conjunctivae normal.     Pupils: Pupils are equal, round, and reactive to light.  Cardiovascular:     Rate and Rhythm: Normal rate and regular rhythm.  Pulmonary:     Effort: Pulmonary effort is normal. No respiratory distress.  Abdominal:     General: There is no distension.     Tenderness: There is no guarding.  Musculoskeletal:        General: No deformity or signs of injury.     Cervical back: Normal range of motion and neck supple.  Skin:    Findings: No lesion or rash.  Neurological:     Mental Status: She is alert.     Comments: MENTAL STATUS EXAM:    Orientation: Alert and oriented to person, disoriented to place and time. Memory: Cooperative, follows commands well.  Language: Speech is clear and language is normal.   CRANIAL NERVES:    CN 2 (Optic): Visual fields intact to confrontation.  CN 3,4,6 (EOM): Pupils equal and reactive to light. Full extraocular eye movement without nystagmus.  CN 5 (Trigeminal): Facial sensation is normal, no weakness of masticatory muscles.  CN 7 (Facial): No facial weakness or asymmetry.  CN 8 (Auditory): Auditory acuity grossly normal.  CN 9,10 (Glossophar): The uvula is midline, the palate elevates symmetrically.  CN 11 (spinal access): Normal sternocleidomastoid and trapezius strength.  CN 12 (Hypoglossal): The tongue is midline. No atrophy or fasciculations.Marland Kitchen   MOTOR:  Muscle Strength:  5/5RUE, 5/5LUE, 5/5RLE, 5/5LLE.   COORDINATION:   No tremor.   SENSATION:   Intact to light touch all four extremities.  GAIT: Not assessed     ED Results / Procedures / Treatments   Labs (all labs ordered are listed, but only abnormal results are displayed) Labs Reviewed  RESP PANEL BY RT-PCR (FLU A&B, COVID) ARPGX2 - Abnormal; Notable for the following components:      Result Value   SARS Coronavirus 2  by RT PCR POSITIVE (*)    All other components within normal limits  BASIC METABOLIC PANEL - Abnormal; Notable for the following components:   Sodium 128 (*)    Potassium 2.9 (*)    Chloride 85 (*)    Glucose, Bld 416 (*)    Calcium 8.7 (*)    All other components within normal limits  CBC - Abnormal; Notable for the following components:   WBC 11.7 (*)    Hemoglobin 11.7 (*)    HCT 34.6 (*)    All other components within normal limits  URIC ACID - Abnormal; Notable for the following components:   Uric Acid, Serum 2.4 (*)    All other components within normal limits  HEPATIC FUNCTION PANEL - Abnormal; Notable for the following components:   Total Protein 6.0 (*)    AST 12 (*)    Alkaline Phosphatase 306 (*)    Bilirubin, Direct 0.4 (*)    All other components within normal limits  MAGNESIUM - Abnormal; Notable for the following components:   Magnesium 1.5 (*)    All other components within normal limits  CBG MONITORING, ED - Abnormal; Notable for the following components:   Glucose-Capillary 428 (*)    All other components within normal limits  I-STAT VENOUS BLOOD GAS, ED - Abnormal; Notable for the following components:   pH, Ven 7.471 (*)    pO2, Ven 17.0 (*)    Bicarbonate 35.6 (*)    TCO2 37 (*)    Acid-Base Excess 11.0 (*)    Sodium 128 (*)    Potassium 2.8 (*)    Calcium, Ion 1.08 (*)    HCT 33.0 (*)    Hemoglobin 11.2 (*)    All other components within normal limits  LACTATE DEHYDROGENASE  AMMONIA  ETHANOL  URINALYSIS, ROUTINE W REFLEX MICROSCOPIC   BETA-HYDROXYBUTYRIC ACID  URINALYSIS, COMPLETE (UACMP) WITH MICROSCOPIC  TSH  CBG MONITORING, ED  CBG MONITORING, ED    EKG EKG Interpretation  Date/Time:  Wednesday August 11 2021 15:53:09 EDT Ventricular Rate:  109 PR Interval:  143 QRS Duration: 92 QT Interval:  358 QTC Calculation: 483 R Axis:   57 Text Interpretation: Sinus tachycardia Anteroseptal infarct, age indeterminate Confirmed by Regan Lemming ([id number]) on 08/11/2021 5:21:24 PM  Radiology DG Chest 2 View  Result Date: 08/11/2021 CLINICAL DATA:  Altered mental status. EXAM: CHEST - 2 VIEW COMPARISON:  Apr 13, 2007 FINDINGS: A right-sided venous Port-A-Cath is seen with its distal tip noted just beyond the junction of the superior vena cava and right atrium. An ill-defined left suprahilar opacity is seen which is new when compared to the prior study. Small ill-defined slightly nodular appearing opacities are seen within the mid right lung and right lung base. There is no evidence of a pleural effusion or pneumothorax. The heart size and mediastinal contours are within normal limits. The visualized skeletal structures are unremarkable. IMPRESSION: 1. Left suprahilar opacity which may represent a perihilar mass and/or lymphadenopathy. Correlation with chest CT is recommended. 2. Additional findings which may represent small right-sided pulmonary nodules. Electronically Signed   By: Virgina Norfolk M.D.   On: 08/11/2021 16:06   CT HEAD WO CONTRAST (5MM)  Result Date: 08/11/2021 CLINICAL DATA:  Hyperglycemia. EXAM: CT HEAD WITHOUT CONTRAST TECHNIQUE: Contiguous axial images were obtained from the base of the skull through the vertex without intravenous contrast. COMPARISON:  August 09, 2007 FINDINGS: Brain: There is mild cerebral atrophy with widening of the extra-axial spaces  and ventricular dilatation. There are areas of decreased attenuation within the white matter tracts of the supratentorial brain, consistent with  microvascular disease changes. Vascular: No hyperdense vessel or unexpected calcification. Skull: Normal. Negative for fracture or focal lesion. Sinuses/Orbits: There is a small right maxillary sinus air-fluid level. Mild bilateral ethmoid sinus and left-sided frontal sinus mucosal thickening is also seen. Other: None. IMPRESSION: 1. Generalized cerebral atrophy. 2. No acute intracranial abnormality. 3. Paranasal sinus disease, as described above. Electronically Signed   By: Virgina Norfolk M.D.   On: 08/11/2021 16:08    Procedures Procedures   Medications Ordered in ED Medications  potassium chloride 10 mEq in 100 mL IVPB (10 mEq Intravenous New Bag/Given 08/11/21 1658)  magnesium oxide (MAG-OX) tablet 400 mg (has no administration in time range)  magnesium sulfate (IV Push/IM) injection 2 g (has no administration in time range)  lactated ringers bolus 1,000 mL (1,000 mLs Intravenous New Bag/Given 08/11/21 1556)  potassium chloride SA (KLOR-CON) CR tablet 40 mEq (40 mEq Oral Given 08/11/21 1656)    ED Course  I have reviewed the triage vital signs and the nursing notes.  Pertinent labs & imaging results that were available during my care of the patient were reviewed by me and considered in my medical decision making (see chart for details).    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  History is provided by the patient states that she has had for the past month a dull frontal headache that is nonradiating.  She currently describes it as 3 out of 10 in severity.  It is waxed and waned over the past month.  Over the past 3 days she has developed nausea and vomiting and decreased oral intake.  Nothing makes her nausea and vomiting better or worse.  She checked  her blood sugar at home and it was found to be 395.  She has had decreased oral intake over the past couple of days and feels generalized weakness and fatigue.  On arrival, the patient was afebrile, tachycardic to 114, mildly hypertensive BP 143/73, saturating 96% on room air.  She was intermittently tachypneic to the high 20s.  She endorses worsening fatigue, lethargy, poor appetite.  She has been able to  tolerate much oral intake over the past few days.  Her blood sugar was hyperglycemic to 395 at home.  On arrival, the patient was hyperglycemic to 428 with a VBG without evidence of acidosis with a pH of 7.47, concern for electrolyte abnormalities with pseudohyponatremia to 128, hypokalemia to 2.8, hypomagnesemia to 1.5.  The patient resulted positive for COVID-19.  Her electrolytes were repleted orally and IV.  She was administered an IV fluid bolus.  Due to fatigue, the patient was unable to ambulate to the restroom and required assistance.  She did not have enough strength to perform an ambulatory pulse oximetry.  At rest she is mildly tachycardic but maintaining oxygen saturations on room air.  Hospitalist medicine consulted for admission due to fatigue, difficulty with ambulation, significant tachypnea in the setting of her known malignancy and COVID-19 diagnosis, further management of her significant electrolyte derangements.  Considered PE as an etiology of her symptoms however the patient  has a known contrast allergy and will need to be extensively premedicated prior to any CTA imaging.    Final Clinical Impression(s) / ED Diagnoses Final diagnoses:  COVID-19  Hypomagnesemia  Hypokalemia  Hyperglycemia    Rx / DC Orders ED Discharge Orders     None        Regan Lemming, MD 08/11/21 1920

## 2021-08-11 NOTE — H&P (Signed)
History and Physical    Sonya Daugherty GEX:[id number] DOB: August 26, 1961 DOA: 08/11/2021  PCP: Chesley Noon, MD   Patient coming from: Home  Chief Complaint: SOB with exertion, nausea and vomiting  HPI: Sonya Daugherty is a 60 y.o. female with medical history significant for marginal zone lymphoma, newly diagnosised DM2 not on therapy, hypertension who presents for evaluation of generalized weakness with nausea and vomiting for the last few days.  She has had decreased p.o. intake and has had elevated blood sugar readings.  She was recently diagnosed with diabetes mellitus has not been placed on any therapy for it yet.  She reports she has a dull headache with some nasal congestion for the last 3 to 4 days and has had intermittent subjective fevers and chills at home the last few days.  She has had multiple bouts of vomiting and not been able to tolerate p.o. intake for the last 2 to 3 days.  She has chronic headache that waxes and wanes over the last few weeks and is not any more severe in the last few days.  She has not been vaccinated for COVID per recommendation from her oncologist she states.  She does not leave the house and always wears her masks and takes precautions when she does.  She lives with her son.  ED Course: In the emergency room she was hemodynamically stable.  She was found of a low potassium and low magnesium level which were repleted in the emergency room.  She was given IV fluids.  She is found to be COVID-positive.  She is not requiring oxygen at rest.  She is excepted to the hospitalist service for further management with risk factors for worsening COVID infection.  Labs show WBC of 11,700 hemoglobin 11.7 hematocrit 34.6, with 3 and 267,000.  Sodium 128 potassium 2.9 chloride 85 bicarb 31 creatinine 0.61 BUN 10 magnesium 1.5 alkaline phosphatase 306 AST 12 ALT 11 bilirubin 1.2 LDH 157, beta hydroxybutyrate 0.07, TSH 0.553  Review of Systems:  General: Reports generalized  weakness and intermittent fever, chills. Denies weight loss, night sweats.  Denies dizziness.  HENT: Denies head trauma, headache, denies change in hearing, tinnitus.  Denies nasal congestion or bleeding.  Denies sore throat, sores in mouth.  Denies difficulty swallowing Eyes: Denies blurry vision, pain in eye, drainage.  Denies discoloration of eyes. Neck: Denies pain.  Denies swelling.  Denies pain with movement. Cardiovascular: Denies chest pain, palpitations.  Denies edema.  Denies orthopnea Respiratory: Reports shortness of breath, cough.  Denies wheezing.  Denies sputum production Gastrointestinal: Denies abdominal pain, swelling.  Denies diarrhea.  Denies melena.  Denies hematemesis. Musculoskeletal: Denies limitation of movement.  Denies deformity or swelling.  Denies pain.  Denies arthralgias or myalgias. Genitourinary: Denies pelvic pain.  Denies urinary frequency or hesitancy.  Denies dysuria.  Skin: Denies rash.  Denies petechiae, purpura, ecchymosis. Neurological: Denies syncope.  Denies seizure activity.  Denies weakness or paresthesia.  Denies slurred speech, drooping face.  Denies visual change. Psychiatric: Denies depression, anxiety.  Denies hallucinations.  Past Medical History:  Diagnosis Date   ADD (attention deficit disorder)    Diabetes mellitus without complication ([place])    Hypertension     Past Surgical History:  Procedure Laterality Date   ABDOMINAL HYSTERECTOMY     CESAREAN SECTION     CHOLECYSTECTOMY      Social History  reports that she quit smoking about 2 years ago. Her smoking use included cigarettes. She has never used smokeless tobacco.  She reports current alcohol use of about 2.0 standard drinks per week. She reports that she does not use drugs.  Allergies  Allergen Reactions   Codeine    Iodinated Diagnostic Agents Rash   Lamisil [Terbinafine] Rash    Family History  Problem Relation Age of Onset   Diabetes Mother    Heart disease Father     Heart disease Brother    Ataxia Neg Hx      Prior to Admission medications   Medication Sig Start Date End Date Taking? Authorizing Provider  amphetamine-dextroamphetamine (ADDERALL) 30 MG tablet Take 1 tablet (30 mg total) by mouth 2 (two) times daily. 10/28/13 10/28/14  Silverio Decamp, MD  LORazepam (ATIVAN) 0.5 MG tablet Take 0.5 mg by mouth every 6 (six) hours as needed for anxiety.    [provider]  losartan-hydrochlorothiazide (HYZAAR) 100-25 MG per tablet Take 1 tablet by mouth daily. 09/03/13   Silverio Decamp, MD  meloxicam (MOBIC) 15 MG tablet One tab PO qAM with breakfast for 2 weeks, then daily prn pain. 04/24/13   Silverio Decamp, MD  Multiple Vitamins-Minerals (MULTIVITAMIN PO) Take by mouth daily.    [provider]  vitamin B-12 (CYANOCOBALAMIN) 500 MCG tablet Take 500 mcg by mouth daily.    [provider]    Physical Exam: Vitals:   08/11/21 1900 08/11/21 1909 08/11/21 1930 08/11/21 2059  BP: 121/68  133/74 94/60  Pulse: 89  81 83  Resp: 14  (!) 22 18  Temp:    98.5 F (36.9 C)  TempSrc:    Oral  SpO2: 95%  94% 97%  Weight:  62.1 kg    Height:  5\' 8"  (1.727 m)      Constitutional: NAD, calm, comfortable Vitals:   08/11/21 1900 08/11/21 1909 08/11/21 1930 08/11/21 2059  BP: 121/68  133/74 94/60  Pulse: 89  81 83  Resp: 14  (!) 22 18  Temp:    98.5 F (36.9 C)  TempSrc:    Oral  SpO2: 95%  94% 97%  Weight:  62.1 kg    Height:  5\' 8"  (1.727 m)     General: WDWN, Alert and oriented x3.  Eyes: EOMI, PERRL, conjunctivae normal.  Sclera nonicteric HENT:  /AT, external ears normal.  Nares patent without epistasis.  Mucous membranes are dry.  Neck: Soft, normal range of motion, supple, no masses, no thyromegaly.  Trachea midline Respiratory: clear to auscultation bilaterally, no wheezing, no crackles. Normal respiratory effort. No accessory muscle use.  Cardiovascular: Regular rhythm and rate, no murmurs / rubs /  gallops. No extremity edema. 2+ pedal pulses. Abdomen: Soft, no tenderness, nondistended, no rebound or guarding.  No masses palpated Bowel sounds normoactive Musculoskeletal: FROM. no cyanosis. No joint deformity upper and lower extremities. Normal muscle tone.  Skin: Warm, dry, intact no rashes, lesions, ulcers. No induration Neurologic: CN 2-12 grossly intact.  Normal speech.  Sensation intact, patella DTR +1 bilaterally. Strength 5/5 in all extremities. No tremor.  Psychiatric: Normal judgment and insight.  Normal mood.    Labs on Admission: I have personally reviewed following labs and imaging studies  CBC: Recent Labs  Lab 08/11/21 1506 08/11/21 1555  WBC 11.7*  --   HGB 11.7* 11.2*  HCT 34.6* 33.0*  MCV 88.5  --   PLT 267  --     Basic Metabolic Panel: Recent Labs  Lab 08/11/21 1506 08/11/21 1555 08/11/21 1623  NA 128* 128*  --  K 2.9* 2.8*  --   CL 85*  --   --   CO2 31  --   --   GLUCOSE 416*  --   --   BUN 10  --   --   CREATININE 0.61  --   --   CALCIUM 8.7*  --   --   MG  --   --  1.5*    GFR: Estimated Creatinine Clearance: 74.2 mL/min (by C-G formula based on SCr of 0.61 mg/dL).  Liver Function Tests: Recent Labs  Lab 08/11/21 1531  AST 12*  ALT 11  ALKPHOS 306*  BILITOT 1.2  PROT 6.0*  ALBUMIN 3.5    Urine analysis:    Component Value Date/Time   COLORURINE YELLOW (A) 08/11/2021 1506   APPEARANCEUR CLEAR 08/11/2021 1506   LABSPEC <1.005 (L) 08/11/2021 1506   PHURINE 6.5 08/11/2021 1506   GLUCOSEU 100 (A) 08/11/2021 1506   HGBUR NEGATIVE 08/11/2021 1506   [place] 08/11/2021 1506   KETONESUR NEGATIVE 08/11/2021 1506   PROTEINUR NEGATIVE 08/11/2021 1506   UROBILINOGEN 0.2 08/09/2007 1957   NITRITE NEGATIVE 08/11/2021 1506   LEUKOCYTESUR TRACE (A) 08/11/2021 1506    Radiological Exams on Admission: DG Chest 2 View  Result Date: 08/11/2021 CLINICAL DATA:  Altered mental status. EXAM: CHEST - 2 VIEW COMPARISON:  Apr 13, 2007 FINDINGS: A right-sided venous Port-A-Cath is seen with its distal tip noted just beyond the junction of the superior vena cava and right atrium. An ill-defined left suprahilar opacity is seen which is new when compared to the prior study. Small ill-defined slightly nodular appearing opacities are seen within the mid right lung and right lung base. There is no evidence of a pleural effusion or pneumothorax. The heart size and mediastinal contours are within normal limits. The visualized skeletal structures are unremarkable. IMPRESSION: 1. Left suprahilar opacity which may represent a perihilar mass and/or lymphadenopathy. Correlation with chest CT is recommended. 2. Additional findings which may represent small right-sided pulmonary nodules. Electronically Signed   By: Virgina Norfolk M.D.   On: 08/11/2021 16:06   CT HEAD WO CONTRAST (5MM)  Result Date: 08/11/2021 CLINICAL DATA:  Hyperglycemia. EXAM: CT HEAD WITHOUT CONTRAST TECHNIQUE: Contiguous axial images were obtained from the base of the skull through the vertex without intravenous contrast. COMPARISON:  August 09, 2007 FINDINGS: Brain: There is mild cerebral atrophy with widening of the extra-axial spaces and ventricular dilatation. There are areas of decreased attenuation within the white matter tracts of the supratentorial brain, consistent with microvascular disease changes. Vascular: No hyperdense vessel or unexpected calcification. Skull: Normal. Negative for fracture or focal lesion. Sinuses/Orbits: There is a small right maxillary sinus air-fluid level. Mild bilateral ethmoid sinus and left-sided frontal sinus mucosal thickening is also seen. Other: None. IMPRESSION: 1. Generalized cerebral atrophy. 2. No acute intracranial abnormality. 3. Paranasal sinus disease, as described above. Electronically Signed   By: Virgina Norfolk M.D.   On: 08/11/2021 16:08    EKG: Independently reviewed.  EKG shows sinus tachycardia with no acute ST  elevation or depression.  QTc 483  Assessment/Plan Principal Problem:   COVID-19 virus infection Sarted on paxlovid Supplemental oxygen provided keep O2 sat between 92 to 96% if it becomes needed. Albuterol MDI every 4 hours as needed for wheezing cough, shortness of breath Antitussives provided Incentive spirometer every 1-2 hours while awake  Active Problems:   Essential hypertension, benign Continue hyzaar.  Monitor BP.     Hypokalemia Pt given  repletion of potassium in ER. Recheck electrolytes in a.m.    Exertional dyspnea As needed.  Sup oxygen as needed if it becomes necessary repeated    Diabetes mellitus type 2 in nonobese Recently diagnosed with diabetes mellitus with a hemoglobin A1c of 7.9 by her PCP.  He is currently not on any medications for diabetes.  PCP is not in the [facility] network     Hypomagnesemia Magnesium was repleted in the emergency room.  Recheck level in the morning    Lymphoma On oral chemotherapy and followed by oncology.     DVT prophylaxis: Lovenox, therapeutic dosing overnight while awaiting CTA chest  Code Status:   Full Code  Family Communication:  Diagnosis and plan discussed with patient and her son who is at bedside.  Questions answered.  Further recommendation follow as clinical indicated.  They verbalized understanding agree with plan. Disposition Plan:   Patient is from:  Home  Anticipated DC to:  Home  Anticipated DC date:  Anticipate less than 2 midnight stay in the hospital  Admission status:  Observation   Yevonne Aline Danel Studzinski MD [facility]  How to contact the [facility] Attending or Consulting provider [place] or covering provider during after hours [place], for this patient?   Check the care team in [facility] and look for a) attending/consulting [facility] provider listed and b) the [facility] team listed Log into [email or web address] and use [place]'s universal password to access. If you do not have the password, please contact the hospital  operator. Locate the [facility] provider you are looking for under [facility] and page to a number that you can be directly reached. If you still have difficulty reaching the provider, please page the [facility] (Director on Call) for the Hospitalists listed on amion for assistance.  08/11/2021, 10:43 PM

## 2021-08-11 NOTE — ED Triage Notes (Signed)
Patient reports to the ER for Hyperglycemia. Patient states her sugar was 395 at home. Patient reportedly has had a poor appetite. States feeling lethargic and weak.

## 2021-08-12 ENCOUNTER — Observation Stay (HOSPITAL_COMMUNITY): Payer: BLUE CROSS/BLUE SHIELD

## 2021-08-12 DIAGNOSIS — D696 Thrombocytopenia, unspecified: Secondary | ICD-10-CM | POA: Diagnosis present

## 2021-08-12 DIAGNOSIS — U071 COVID-19: Secondary | ICD-10-CM | POA: Diagnosis present

## 2021-08-12 DIAGNOSIS — F988 Other specified behavioral and emotional disorders with onset usually occurring in childhood and adolescence: Secondary | ICD-10-CM | POA: Diagnosis present

## 2021-08-12 DIAGNOSIS — I10 Essential (primary) hypertension: Secondary | ICD-10-CM

## 2021-08-12 DIAGNOSIS — D638 Anemia in other chronic diseases classified elsewhere: Secondary | ICD-10-CM | POA: Diagnosis present

## 2021-08-12 DIAGNOSIS — C8580 Other specified types of non-Hodgkin lymphoma, unspecified site: Secondary | ICD-10-CM

## 2021-08-12 DIAGNOSIS — Z87891 Personal history of nicotine dependence: Secondary | ICD-10-CM | POA: Diagnosis not present

## 2021-08-12 DIAGNOSIS — E119 Type 2 diabetes mellitus without complications: Secondary | ICD-10-CM | POA: Diagnosis not present

## 2021-08-12 DIAGNOSIS — Z28310 Unvaccinated for covid-19: Secondary | ICD-10-CM | POA: Diagnosis not present

## 2021-08-12 DIAGNOSIS — A4152 Sepsis due to Pseudomonas: Secondary | ICD-10-CM | POA: Diagnosis not present

## 2021-08-12 DIAGNOSIS — E860 Dehydration: Secondary | ICD-10-CM | POA: Diagnosis present

## 2021-08-12 DIAGNOSIS — J189 Pneumonia, unspecified organism: Secondary | ICD-10-CM

## 2021-08-12 DIAGNOSIS — R6520 Severe sepsis without septic shock: Secondary | ICD-10-CM | POA: Diagnosis not present

## 2021-08-12 DIAGNOSIS — J1282 Pneumonia due to coronavirus disease 2019: Secondary | ICD-10-CM | POA: Diagnosis present

## 2021-08-12 DIAGNOSIS — E1165 Type 2 diabetes mellitus with hyperglycemia: Secondary | ICD-10-CM | POA: Diagnosis present

## 2021-08-12 DIAGNOSIS — E785 Hyperlipidemia, unspecified: Secondary | ICD-10-CM | POA: Diagnosis present

## 2021-08-12 DIAGNOSIS — Z885 Allergy status to narcotic agent status: Secondary | ICD-10-CM | POA: Diagnosis not present

## 2021-08-12 DIAGNOSIS — C8590 Non-Hodgkin lymphoma, unspecified, unspecified site: Secondary | ICD-10-CM | POA: Diagnosis present

## 2021-08-12 DIAGNOSIS — G9341 Metabolic encephalopathy: Secondary | ICD-10-CM | POA: Diagnosis present

## 2021-08-12 DIAGNOSIS — D849 Immunodeficiency, unspecified: Secondary | ICD-10-CM | POA: Diagnosis present

## 2021-08-12 DIAGNOSIS — R0600 Dyspnea, unspecified: Secondary | ICD-10-CM | POA: Diagnosis not present

## 2021-08-12 DIAGNOSIS — J159 Unspecified bacterial pneumonia: Secondary | ICD-10-CM | POA: Diagnosis present

## 2021-08-12 DIAGNOSIS — D6489 Other specified anemias: Secondary | ICD-10-CM | POA: Diagnosis present

## 2021-08-12 DIAGNOSIS — R579 Shock, unspecified: Secondary | ICD-10-CM | POA: Diagnosis present

## 2021-08-12 DIAGNOSIS — E876 Hypokalemia: Secondary | ICD-10-CM

## 2021-08-12 DIAGNOSIS — R627 Adult failure to thrive: Secondary | ICD-10-CM | POA: Diagnosis present

## 2021-08-12 DIAGNOSIS — J151 Pneumonia due to Pseudomonas: Secondary | ICD-10-CM | POA: Diagnosis present

## 2021-08-12 LAB — COMPREHENSIVE METABOLIC PANEL
ALT: 14 U/L (ref 0–44)
AST: 17 U/L (ref 15–41)
Albumin: 2.8 g/dL — ABNORMAL LOW (ref 3.5–5.0)
Alkaline Phosphatase: 299 U/L — ABNORMAL HIGH (ref 38–126)
Anion gap: 8 (ref 5–15)
BUN: 7 mg/dL (ref 6–20)
CO2: 32 mmol/L (ref 22–32)
Calcium: 8.3 mg/dL — ABNORMAL LOW (ref 8.9–10.3)
Chloride: 95 mmol/L — ABNORMAL LOW (ref 98–111)
Creatinine, Ser: 0.44 mg/dL (ref 0.44–1.00)
GFR, Estimated: >60 mL/min (ref >60)
Glucose, Bld: 271 mg/dL — ABNORMAL HIGH (ref 70–99)
Potassium: 2.7 mmol/L — CL (ref 3.5–5.1)
Sodium: 135 mmol/L (ref 135–145)
Total Bilirubin: 1.4 mg/dL — ABNORMAL HIGH (ref 0.3–1.2)
Total Protein: 5.7 g/dL — ABNORMAL LOW (ref 6.5–8.1)

## 2021-08-12 LAB — GLUCOSE, CAPILLARY
Glucose-Capillary: 309 mg/dL — ABNORMAL HIGH (ref 70–99)
Glucose-Capillary: 351 mg/dL — ABNORMAL HIGH (ref 70–99)
Glucose-Capillary: 413 mg/dL — ABNORMAL HIGH (ref 70–99)
Glucose-Capillary: 159 mg/dL — ABNORMAL HIGH (ref 70–99)

## 2021-08-12 LAB — MAGNESIUM: Magnesium: 1.9 mg/dL (ref 1.7–2.4)

## 2021-08-12 LAB — MRSA NEXT GEN BY PCR, NASAL: MRSA by PCR Next Gen: NOT DETECTED

## 2021-08-12 LAB — CBC WITH DIFFERENTIAL/PLATELET
Abs Immature Granulocytes: 0.07 10*3/uL (ref 0.00–0.07)
Basophils Absolute: 0 10*3/uL (ref 0.0–0.1)
Basophils Relative: 0 %
Eosinophils Absolute: 0 10*3/uL (ref 0.0–0.5)
Eosinophils Relative: 0 %
HCT: 31.5 % — ABNORMAL LOW (ref 36.0–46.0)
Hemoglobin: 10.5 g/dL — ABNORMAL LOW (ref 12.0–15.0)
Immature Granulocytes: 1 %
Lymphocytes Relative: 1 %
Lymphs Abs: 0.1 10*3/uL — ABNORMAL LOW (ref 0.7–4.0)
MCH: 30.3 pg (ref 26.0–34.0)
MCHC: 33.3 g/dL (ref 30.0–36.0)
MCV: 90.8 fL (ref 80.0–100.0)
Monocytes Absolute: 0.3 10*3/uL (ref 0.1–1.0)
Monocytes Relative: 3 %
Neutro Abs: 12.2 10*3/uL — ABNORMAL HIGH (ref 1.7–7.7)
Neutrophils Relative %: 95 %
Platelets: 190 10*3/uL (ref 150–400)
RBC: 3.47 MIL/uL — ABNORMAL LOW (ref 3.87–5.11)
RDW: 13.2 % (ref 11.5–15.5)
WBC: 12.7 10*3/uL — ABNORMAL HIGH (ref 4.0–10.5)
nRBC: 0 % (ref 0.0–0.2)

## 2021-08-12 LAB — C-REACTIVE PROTEIN: CRP: 15.4 mg/dL — ABNORMAL HIGH (ref <1.0)

## 2021-08-12 LAB — BASIC METABOLIC PANEL
Anion gap: 11 (ref 5–15)
BUN: 11 mg/dL (ref 6–20)
CO2: 27 mmol/L (ref 22–32)
Calcium: 8.5 mg/dL — ABNORMAL LOW (ref 8.9–10.3)
Chloride: 98 mmol/L (ref 98–111)
Creatinine, Ser: 0.48 mg/dL (ref 0.44–1.00)
GFR, Estimated: >60 mL/min (ref >60)
Glucose, Bld: 444 mg/dL — ABNORMAL HIGH (ref 70–99)
Potassium: 4.1 mmol/L (ref 3.5–5.1)
Sodium: 136 mmol/L (ref 135–145)

## 2021-08-12 LAB — D-DIMER, QUANTITATIVE: D-Dimer, Quant: 0.45 ug/mL-FEU (ref 0.00–0.50)

## 2021-08-12 LAB — HIV ANTIBODY (ROUTINE TESTING W REFLEX): HIV Screen 4th Generation wRfx: NONREACTIVE

## 2021-08-12 LAB — PROCALCITONIN: Procalcitonin: 0.67 ng/mL

## 2021-08-12 MED ORDER — INSULIN ASPART 100 UNIT/ML IJ SOLN
0.0000 [IU] | Freq: Three times a day (TID) | INTRAMUSCULAR | Status: DC
  Administered 2021-08-13: 3 [IU] via SUBCUTANEOUS
  Administered 2021-08-13: 5 [IU] via SUBCUTANEOUS
  Administered 2021-08-13 – 2021-08-14 (×2): 3 [IU] via SUBCUTANEOUS
  Administered 2021-08-14: 2 [IU] via SUBCUTANEOUS
  Administered 2021-08-15: 5 [IU] via SUBCUTANEOUS
  Administered 2021-08-15 – 2021-08-16 (×3): 2 [IU] via SUBCUTANEOUS

## 2021-08-12 MED ORDER — INSULIN GLARGINE-YFGN 100 UNIT/ML ~~LOC~~ SOLN
10.0000 [IU] | Freq: Every day | SUBCUTANEOUS | Status: DC
  Administered 2021-08-12 – 2021-08-15 (×4): 10 [IU] via SUBCUTANEOUS
  Administered 2021-08-16: 5 [IU] via SUBCUTANEOUS
  Filled 2021-08-12 (×6): qty 0.1

## 2021-08-12 MED ORDER — SODIUM CHLORIDE 0.9 % IV SOLN
200.0000 mg | Freq: Once | INTRAVENOUS | Status: AC
  Administered 2021-08-12: 200 mg via INTRAVENOUS
  Filled 2021-08-12: qty 40

## 2021-08-12 MED ORDER — PANTOPRAZOLE SODIUM 40 MG PO TBEC
40.0000 mg | DELAYED_RELEASE_TABLET | Freq: Every day | ORAL | Status: DC
  Administered 2021-08-12 – 2021-08-16 (×5): 40 mg via ORAL
  Filled 2021-08-12 (×5): qty 1

## 2021-08-12 MED ORDER — POTASSIUM CHLORIDE 10 MEQ/100ML IV SOLN
10.0000 meq | INTRAVENOUS | Status: AC
  Administered 2021-08-12 (×6): 10 meq via INTRAVENOUS
  Filled 2021-08-12 (×5): qty 100

## 2021-08-12 MED ORDER — ACETAMINOPHEN 500 MG PO TABS
1000.0000 mg | ORAL_TABLET | Freq: Four times a day (QID) | ORAL | Status: DC | PRN
  Administered 2021-08-13: 1000 mg via ORAL
  Filled 2021-08-12: qty 2

## 2021-08-12 MED ORDER — INSULIN ASPART 100 UNIT/ML IJ SOLN
15.0000 [IU] | Freq: Once | INTRAMUSCULAR | Status: AC
  Administered 2021-08-12: 15 [IU] via SUBCUTANEOUS

## 2021-08-12 MED ORDER — INSULIN ASPART 100 UNIT/ML IJ SOLN: 0.0000 [IU] | Freq: Every day | INTRAMUSCULAR | Status: DC

## 2021-08-12 MED ORDER — TRAMADOL HCL 50 MG PO TABS
50.0000 mg | ORAL_TABLET | Freq: Four times a day (QID) | ORAL | Status: DC | PRN
  Administered 2021-08-12 – 2021-08-15 (×3): 50 mg via ORAL
  Filled 2021-08-12 (×3): qty 1

## 2021-08-12 MED ORDER — SODIUM CHLORIDE 0.9 % IV SOLN
2.0000 g | Freq: Three times a day (TID) | INTRAVENOUS | Status: DC
  Administered 2021-08-12 – 2021-08-17 (×14): 2 g via INTRAVENOUS
  Filled 2021-08-12 (×17): qty 2

## 2021-08-12 MED ORDER — PROCHLORPERAZINE EDISYLATE 10 MG/2ML IJ SOLN
10.0000 mg | Freq: Four times a day (QID) | INTRAMUSCULAR | Status: DC | PRN
  Administered 2021-08-13: 10 mg via INTRAVENOUS
  Filled 2021-08-12: qty 2

## 2021-08-12 MED ORDER — ENOXAPARIN SODIUM 40 MG/0.4ML IJ SOSY
40.0000 mg | PREFILLED_SYRINGE | INTRAMUSCULAR | Status: DC
  Administered 2021-08-13 – 2021-08-16 (×4): 40 mg via SUBCUTANEOUS
  Filled 2021-08-12 (×4): qty 0.4

## 2021-08-12 MED ORDER — ACETAMINOPHEN 650 MG RE SUPP
650.0000 mg | RECTAL | Status: DC | PRN
  Filled 2021-08-12: qty 1

## 2021-08-12 MED ORDER — IBUPROFEN 200 MG PO TABS
400.0000 mg | ORAL_TABLET | Freq: Four times a day (QID) | ORAL | Status: AC | PRN
Start: 2021-08-12 — End: 2021-08-14
  Administered 2021-08-12 – 2021-08-14 (×2): 400 mg via ORAL
  Filled 2021-08-12 (×4): qty 2

## 2021-08-12 MED ORDER — ONDANSETRON HCL 4 MG/2ML IJ SOLN
4.0000 mg | Freq: Four times a day (QID) | INTRAMUSCULAR | Status: DC | PRN
  Administered 2021-08-12: 4 mg via INTRAVENOUS
  Filled 2021-08-12: qty 2

## 2021-08-12 MED ORDER — LIVING WELL WITH DIABETES BOOK
Freq: Once | Status: DC
  Filled 2021-08-12: qty 1

## 2021-08-12 MED ORDER — CALCIUM CARBONATE ANTACID 500 MG PO CHEW
1.0000 | CHEWABLE_TABLET | Freq: Three times a day (TID) | ORAL | Status: DC | PRN
  Administered 2021-08-12: 200 mg via ORAL
  Filled 2021-08-12: qty 1

## 2021-08-12 MED ORDER — IBUPROFEN 100 MG/5ML PO SUSP
200.0000 mg | Freq: Once | ORAL | Status: AC
  Administered 2021-08-12: 200 mg via ORAL
  Filled 2021-08-12: qty 10

## 2021-08-12 MED ORDER — SODIUM CHLORIDE 0.9 % IV SOLN
100.0000 mg | Freq: Every day | INTRAVENOUS | Status: AC
  Administered 2021-08-13 – 2021-08-16 (×4): 100 mg via INTRAVENOUS
  Filled 2021-08-12 (×4): qty 20

## 2021-08-12 MED ORDER — IOHEXOL 350 MG/ML SOLN
75.0000 mL | Freq: Once | INTRAVENOUS | Status: AC | PRN
  Administered 2021-08-12: 75 mL via INTRAVENOUS

## 2021-08-12 MED ORDER — ENSURE ENLIVE PO LIQD
237.0000 mL | Freq: Two times a day (BID) | ORAL | Status: DC
  Administered 2021-08-12 – 2021-08-15 (×4): 237 mL via ORAL

## 2021-08-12 MED ORDER — LINAGLIPTIN 5 MG PO TABS
5.0000 mg | ORAL_TABLET | Freq: Every day | ORAL | Status: DC
  Administered 2021-08-12 – 2021-08-16 (×5): 5 mg via ORAL
  Filled 2021-08-12 (×6): qty 1

## 2021-08-12 MED ORDER — ENSURE ENLIVE PO LIQD
237.0000 mL | Freq: Two times a day (BID) | ORAL | Status: DC
  Administered 2021-08-12 – 2021-08-16 (×4): 237 mL via ORAL

## 2021-08-12 NOTE — Progress Notes (Signed)
PROGRESS NOTE  Sonya Daugherty  MGQ:[id number] DOB: 1961/06/29 DOA: 08/11/2021 PCP: Chesley Noon, MD  Outpatient Specialists: Oncology, Dr. Marylou Mccoy Brief Narrative: Sonya Daugherty is a 60 y.o. 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 In the ED she was febrile (103.29F), with neutrophilic leukocytosis (WBC 12.7k) with lymphopenia (100), appeared dehydrated with severe hypokalemia, hypomagnesemia, and hyperglycemia without acidosis or ketonemia. SARS-CoV-2 PCR was positive. After abnormal CXR, CTA chest revealed no PE but showed hilar mass infiltrating mediastinum with left perihilar consolidation concerning for postobstructive pneumonia, presumably due to known lymphoma. Paxlovid was initially given, changed to remdesivir. Cefepime is started for bacterial pneumonia as well. Insulin, IV fluids and electrolyte supplementation have been ongoing as well.   Assessment & Plan: Principal Problem:   COVID-19 virus infection Active Problems:   Essential hypertension, benign   Hypokalemia   Hypomagnesemia   Lymphoma (HCC)   Exertional dyspnea   Diabetes mellitus type 2 in nonobese ([place])   COVID-19  Covid-19 pneumonia in immunocompromised patient: Not vaccinated, currently on oral chemotherapy for lymphoma. PCR +9/21. Pt reports previous positive test but wasn't recently and she can't recall when. In Care Everywhere, I see negative PCR tests 06/08/19, 09/26/19, 12/27/19, 02/13/20, 04/24/20 and 05/11/2020. +RUL GGO on CTA consistent with covid infiltrate. Noted to be lymphopenic  - Change to remdesivir given her vomiting and that covid is not incidental in her case.  - Received large dose of solucortef for hx contrast allergy, but not truly hypoxic, so not continuing  steroids at this time.  - Isolation x10 days - IS, FV, OOB  Postobstructive pneumonia: Consolidation with air bronchograms on my personal review of today's CTA chest.  - Start cefepime due to her exposure to healthcare environment. If not improving, would need MRSA coverage.  - Check blood cultures now. Check sputum culture.  Marginal Zone Lymphoma: Per notes from Dr. Marylou Mccoy, history is: Dx stage III, low grade MZL started on bendamustine, rituximab 06/22/20, completed Dec 2021, PET scan 03/02/21 showed disease progression for which zanubrutinib was started 04/12/2021. She is scheduled to have follow up CT scan prior to next visit with oncology Oct 2022.  - Hold chemotherapy for now with active infection  Hypokalemia: Severe and refractory to supplementation.  - Augment supplementation, give Mg, recheck this PM to inform further needs.   T2DM: Unclear precipitant, HbA1c 7.9% 02/18/2021, 6.2% 05/13/2020, 7.4% 12/30/19, 7.7% 07/30/2018, 8.2% 07/18/2018. Has not been on treatment PTA.  - Hyperglycemic here precipitated by acute infection, covid, and worsened with high dose steroids. Augment to moderate SSI, start 10u basal and monitor.  - Will start linagliptin, check HbA1c (would risk hypoglycemia if overshooting steroid-induced hyperglycemia with lower A1c).   Vomiting:  - Continue IVF  DVT prophylaxis: Lovenox, decrease to 40mg  q24h Code Status: Full Family Communication: None at bedside Disposition Plan:  Status is: Inpatient  Remains inpatient appropriate because:Persistent severe electrolyte disturbances, Ongoing diagnostic testing needed not appropriate for outpatient work up, IV treatments appropriate due to intensity of illness or inability to take PO, and Inpatient level of care appropriate due to severity of illness  Dispo: The patient is from: Home              Anticipated d/c is to: Home  Patient currently is not medically stable to d/c.  Consultants:  None  Procedures:   None  Antimicrobials: Paxlovid 9/21 Remdesivir 9/22 >> Cefepime 9/22 >>   Subjective: Feels generally slightly better but still with headache, intermittent fevers. Drenched the bed in sweat earlier today which she's done every night for 3 nights. No per oral intake. Was lethargic this AM per RN but more alert now. Some cough, no dyspnea or chest pain reported.   Objective: Vitals:   08/12/21 0448 08/12/21 0849 08/12/21 1224 08/12/21 1350  BP:  101/69 94/79   Pulse: 78 60 71   Resp:  20 20   Temp:   (!) 95.3 F (35.2 C) 98 F (36.7 C)  TempSrc:   Oral Oral  SpO2: 96% 96% 100%   Weight:      Height:        Intake/Output Summary (Last 24 hours) at 08/12/2021 1405 Last data filed at 08/11/2021 1910 Gross per 24 hour  Intake 1250 ml  Output --  Net 1250 ml   Filed Weights   08/11/21 1909  Weight: 62.1 kg   Gen: Chronically ill-appearing female in no distress  Pulm: Non-labored breathing, diminished on L > R without crackles or wheezes.  CV: Regular rate and rhythm. No murmur, rub, or gallop. No JVD, no pedal edema. GI: Abdomen soft, non-tender, non-distended, with normoactive bowel sounds. No organomegaly or masses felt. Ext: Warm, no deformities Skin: No rashes, lesions or ulcers. R port site accessed without erythema, induration, exudate or tenderness Neuro: Alert and oriented. No focal neurological deficits. Psych: Judgement and insight appear normal. Mood & affect appropriate.   Data Reviewed: I have personally reviewed following labs and imaging studies  CBC: Recent Labs  Lab 08/11/21 1506 08/11/21 1555 08/12/21 0415  WBC 11.7*  --  12.7*  NEUTROABS  --   --  12.2*  HGB 11.7* 11.2* 10.5*  HCT 34.6* 33.0* 31.5*  MCV 88.5  --  90.8  PLT 267  --  947   Basic Metabolic Panel: Recent Labs  Lab 08/11/21 1506 08/11/21 1555 08/11/21 1623 08/12/21 0415  NA 128* 128*  --  135  K 2.9* 2.8*  --  2.7*  CL 85*  --   --  95*  CO2 31  --   --  32  GLUCOSE 416*   --   --  271*  BUN 10  --   --  7  CREATININE 0.61  --   --  0.44  CALCIUM 8.7*  --   --  8.3*  MG  --   --  1.5* 1.9   GFR: Estimated Creatinine Clearance: 74.2 mL/min (by C-G formula based on SCr of 0.44 mg/dL). Liver Function Tests: Recent Labs  Lab 08/11/21 1531 08/12/21 0415  AST 12* 17  ALT 11 14  ALKPHOS 306* 299*  BILITOT 1.2 1.4*  PROT 6.0* 5.7*  ALBUMIN 3.5 2.8*   No results for input(s): LIPASE, AMYLASE in the last 168 hours. Recent Labs  Lab 08/11/21 1534  AMMONIA 30   Coagulation Profile: No results for input(s): INR, PROTIME in the last 168 hours. Cardiac Enzymes: No results for input(s): CKTOTAL, CKMB, CKMBINDEX, TROPONINI in the last 168 hours. BNP (last 3 results) No results for input(s): PROBNP in the last 8760 hours. HbA1C: No results for input(s): HGBA1C in the last 72 hours. CBG: Recent Labs  Lab 08/11/21 1459 08/11/21 1813 08/12/21 0807 08/12/21 1217  GLUCAP 428* 248* 309* 351*   Lipid  Profile: No results for input(s): CHOL, HDL, LDLCALC, TRIG, CHOLHDL, LDLDIRECT in the last 72 hours. Thyroid Function Tests: Recent Labs    08/11/21 1531  TSH 0.553   Anemia Panel: No results for input(s): VITAMINB12, FOLATE, FERRITIN, TIBC, IRON, RETICCTPCT in the last 72 hours. Urine analysis:    Component Value Date/Time   COLORURINE YELLOW (A) 08/11/2021 1506   APPEARANCEUR CLEAR 08/11/2021 1506   LABSPEC <1.005 (L) 08/11/2021 1506   PHURINE 6.5 08/11/2021 1506   GLUCOSEU 100 (A) 08/11/2021 1506   HGBUR NEGATIVE 08/11/2021 1506   BILIRUBINUR NEGATIVE 08/11/2021 1506   KETONESUR NEGATIVE 08/11/2021 1506   PROTEINUR NEGATIVE 08/11/2021 1506   UROBILINOGEN 0.2 08/09/2007 1957   NITRITE NEGATIVE 08/11/2021 1506   LEUKOCYTESUR TRACE (A) 08/11/2021 1506   Recent Results (from the past 240 hour(s))  Resp Panel by RT-PCR (Flu A&B, Covid) Nasopharyngeal Swab     Status: Abnormal   Collection Time: 08/11/21  3:07 PM   Specimen: Nasopharyngeal Swab;  Nasopharyngeal(NP) swabs in vial transport medium  Result Value Ref Range Status   SARS Coronavirus 2 by RT PCR POSITIVE (A) NEGATIVE Final    Comment: RESULT CALLED TO, READ BACK BY AND VERIFIED WITH: T CARBONE,RN 1651 08/11/21 DBRADLEY (NOTE) SARS-CoV-2 target nucleic acids are DETECTED.  The SARS-CoV-2 RNA is generally detectable in upper respiratory specimens during the acute phase of infection. Positive results are indicative of the presence of the identified virus, but do not rule out bacterial infection or co-infection with other pathogens not detected by the test. Clinical correlation with patient history and other diagnostic information is necessary to determine patient infection status. The expected result is Negative.  Fact Sheet for Patients: EntrepreneurPulse.com.au  Fact Sheet for Healthcare Providers: IncredibleEmployment.be  This test is not yet approved or cleared by the [place] FDA and  has been authorized for detection and/or diagnosis of SARS-CoV-2 by FDA under an Emergency Use Authorization (EUA).  This EUA will remain in effect (meaning this test can b e used) for the duration of  the COVID-19 declaration under Section 564(b)(1) of the Act, 21 U.S.C. section 360bbb-3(b)(1), unless the authorization is terminated or revoked sooner.     Influenza A by PCR NEGATIVE NEGATIVE Final   Influenza B by PCR NEGATIVE NEGATIVE Final    Comment: (NOTE) The Xpert Xpress SARS-CoV-2/FLU/RSV plus assay is intended as an aid in the diagnosis of influenza from Nasopharyngeal swab specimens and should not be used as a sole basis for treatment. Nasal washings and aspirates are unacceptable for Xpert Xpress SARS-CoV-2/FLU/RSV testing.  Fact Sheet for Patients: EntrepreneurPulse.com.au  Fact Sheet for Healthcare Providers: IncredibleEmployment.be  This test is not yet approved or cleared by the  [place] FDA and has been authorized for detection and/or diagnosis of SARS-CoV-2 by FDA under an Emergency Use Authorization (EUA). This EUA will remain in effect (meaning this test can be used) for the duration of the COVID-19 declaration under Section 564(b)(1) of the Act, 21 U.S.C. section 360bbb-3(b)(1), unless the authorization is terminated or revoked.  Performed at [facility], [address] [place], [place] [address]       Radiology Studies: DG Chest 2 View  Result Date: 08/11/2021 CLINICAL DATA:  Altered mental status. EXAM: CHEST - 2 VIEW COMPARISON:  Apr 13, 2007 FINDINGS: A right-sided venous Port-A-Cath is seen with its distal tip noted just beyond the junction of the superior vena cava and right atrium. An ill-defined left suprahilar opacity is seen which is new  when compared to the prior study. Small ill-defined slightly nodular appearing opacities are seen within the mid right lung and right lung base. There is no evidence of a pleural effusion or pneumothorax. The heart size and mediastinal contours are within normal limits. The visualized skeletal structures are unremarkable. IMPRESSION: 1. Left suprahilar opacity which may represent a perihilar mass and/or lymphadenopathy. Correlation with chest CT is recommended. 2. Additional findings which may represent small right-sided pulmonary nodules. Electronically Signed   By: Virgina Norfolk M.D.   On: 08/11/2021 16:06   CT HEAD WO CONTRAST (5MM)  Result Date: 08/11/2021 CLINICAL DATA:  Hyperglycemia. EXAM: CT HEAD WITHOUT CONTRAST TECHNIQUE: Contiguous axial images were obtained from the base of the skull through the vertex without intravenous contrast. COMPARISON:  August 09, 2007 FINDINGS: Brain: There is mild cerebral atrophy with widening of the extra-axial spaces and ventricular dilatation. There are areas of decreased attenuation within the white matter tracts of the supratentorial brain,  consistent with microvascular disease changes. Vascular: No hyperdense vessel or unexpected calcification. Skull: Normal. Negative for fracture or focal lesion. Sinuses/Orbits: There is a small right maxillary sinus air-fluid level. Mild bilateral ethmoid sinus and left-sided frontal sinus mucosal thickening is also seen. Other: None. IMPRESSION: 1. Generalized cerebral atrophy. 2. No acute intracranial abnormality. 3. Paranasal sinus disease, as described above. Electronically Signed   By: Virgina Norfolk M.D.   On: 08/11/2021 16:08   CT Angio Chest Pulmonary Embolism (PE) W or WO Contrast  Result Date: 08/12/2021 CLINICAL DATA:  60 year old female positive COVID-19. Abnormal chest x-ray, left hilum yesterday. EMR states history of lymphoma. Also history of contrast allergy (rash), steroid premedicated this morning. EXAM: CT ANGIOGRAPHY CHEST WITH CONTRAST TECHNIQUE: Multidetector CT imaging of the chest was performed using the standard protocol during bolus administration of intravenous contrast. Multiplanar CT image reconstructions and MIPs were obtained to evaluate the vascular anatomy. CONTRAST:  79mL OMNIPAQUE IOHEXOL 350 MG/ML SOLN COMPARISON:  Chest radiographs 08/11/2021. Report of chest CTA 01/27/2000 (no images available). FINDINGS: Cardiovascular: Good contrast bolus timing in the pulmonary arterial tree. No focal filling defect identified in the pulmonary arteries to suggest acute pulmonary embolism. Right chest Port-A-Cath in place. Calcified aortic atherosclerosis. Calcified coronary artery atherosclerosis. No cardiomegaly or pericardial effusion. Mediastinum/Nodes: Abnormal soft tissue at the left hilum seems to track contiguously to the precarinal station where 18 mm round soft tissue is present on series 5, image 35. Superimposed small paratracheal and prevascular lymph nodes. No right hilar lymphadenopathy. Lungs/Pleura: Major airways are patent, although there is narrowing of the left lung  lobar airways at the hilum. Confluent regional low-density soft tissue which also surrounds and mildly narrows the adjacent pulmonary artery branches encompasses 5-6 cm as seen on series 7, image 62. Beyond that there is perihilar consolidation in the left lower lobe with air bronchograms. The superior segment is most affected and the basilar segments are relatively spared. Left upper lobe and lingula relatively spared. No superimposed pleural effusion. Contralateral right major airways and right lung parenchyma appear more normal. There is a distal peribronchial 1-2 cm area of ground-glass opacity in the right upper lobe abutting the minor fissure on series 11, image 61. No right pleural effusion. Upper Abdomen: Partially visible splenomegaly. Estimated splenic volume 800 mL (normal splenic volume range 83 - 412 mL). surgically absent gallbladder. Negative visible liver. Oval 21 mm enlargement of the right adrenal gland has intermediate density and is indeterminate. Left adrenal gland is within normal limits. Negative visible kidneys  and bowel. Musculoskeletal: Osteopenia. Mild T5 anterior wedge compression fracture is age indeterminate but probably chronic. No superimposed acute or destructive osseous lesion is identified. Review of the MIP images confirms the above findings. IMPRESSION: 1. Negative for acute pulmonary embolus. 2. Left hilar mass-like soft tissue encases and mildly narrows the left hilar airways and pulmonary artery branches, also appears to infiltrate into the mediastinum to the precarinal nodal station. Surrounding left perihilar consolidation with air bronchograms. No pleural effusion. This most resembles left hilar tumor with postobstructive Pneumonia. Lymphoma and Bronchogenic Carcinoma are considerations in this setting. 3. Partially visible splenomegaly, suspicious for active Lymphoma related in this setting. 4. Indeterminate 21 mm right adrenal nodule. 5. Mild subpleural ground-glass  opacity in the right upper lobe, nonspecific but might be a focus of COVID-19 pneumonia. 6. Calcified coronary artery and Aortic Atherosclerosis (ICD10-I70.0). Electronically Signed   By: Genevie Ann M.D.   On: 08/12/2021 07:20    Scheduled Meds:  albuterol  2 puff Inhalation Q6H   Chlorhexidine Gluconate Cloth  6 each Topical Daily   enoxaparin (LOVENOX) injection  60 mg Subcutaneous Q12H   feeding supplement  237 mL Oral BID BM   feeding supplement  237 mL Oral BID BM   insulin aspart  0-15 Units Subcutaneous TID WC   insulin aspart  0-5 Units Subcutaneous QHS   insulin glargine-yfgn  10 Units Subcutaneous Daily   living well with diabetes book   Does not apply Once   multivitamin with minerals  1 tablet Oral Daily   Continuous Infusions:  lactated ringers 100 mL/hr at 08/12/21 0759   [START ON 08/13/2021] remdesivir 100 mg in NS 100 mL       LOS: 0 days   Time spent: 35 minutes.  Patrecia Pour, MD [facility] [email or web address] 08/12/2021, 2:05 PM

## 2021-08-12 NOTE — Progress Notes (Signed)
Pt refused for the rectal temp. Was trying to get temp this could not get it on oral and axillary.pt is sweating proufesly. Will continue monitor the pt

## 2021-08-12 NOTE — Progress Notes (Signed)
Noted that IV fluid tubing was not connected to patient but pump was on and fluid was running onto the floor. Took down tubing down and threw away. Infusion connected to PORT was not infusing. Pump off. Notified nurse. Fran Lowes, RN VAST

## 2021-08-12 NOTE — Progress Notes (Signed)
Initial Nutrition Assessment  INTERVENTION:   -Ensure Plus PO BID, each provides 350 kcals and 13g protein  -Magic cup BID with meals, each supplement provides 290 kcal and 9 grams of protein  NUTRITION DIAGNOSIS:   Increased nutrient needs related to acute illness as evidenced by estimated needs.  GOAL:   Patient will meet greater than or equal to 90% of their needs  MONITOR:   PO intake, Supplement acceptance, Labs, Weight trends, I & O's  REASON FOR ASSESSMENT:   Malnutrition Screening Tool    ASSESSMENT:   60 y.o. female with medical history significant for marginal zone lymphoma, newly diagnosised DM2 not on therapy, hypertension who presents for evaluation of generalized weakness with nausea and vomiting for the last few days.  Patient reporting poor appetite d/t N/V for 2-3 days. Pt has been COVID-19 + since 9/21.  Pt has been ordered Ensure supplements and daily MVI.  Will add Magic cups for additional kcals and protein.  Per weight records, pt has lost 7 lbs since 8/19 (4% wt loss x 1 month, insignificant for time frame).  Medications: Lactated ringers, KCl, Senokot  Labs reviewed: CBGs: 248-351 Low K   NUTRITION - FOCUSED PHYSICAL EXAM:  Deferred.  Diet Order:   Diet Order             Diet heart healthy/carb modified Room service appropriate? Yes; Fluid consistency: Thin  Diet effective now                   EDUCATION NEEDS:   Education needs have been addressed  Skin:  Skin Assessment: Reviewed RN Assessment  Last BM:  9/19  Height:   Ht Readings from Last 1 Encounters:  08/11/21 5\' 8"  (1.727 m)    Weight:   Wt Readings from Last 1 Encounters:  08/11/21 62.1 kg    BMI:  Body mass index is 20.83 kg/m.  Estimated Nutritional Needs:   Kcal:  1600-1800  Protein:  80-95g  Fluid:  1.8L/day  Clayton Bibles, MS, RD, LDN Inpatient Clinical Dietitian Contact information available via Amion

## 2021-08-12 NOTE — Significant Event (Signed)
Rapid Response Event Note   Reason for Call :  Red MEWS  Initial Focused Assessment:  Pt sleeping in bed. Awakens to voice. Temp 102.8. HR 110s. Pt is easy to wake, however lethargic. Follows commands, but must be prompted multiple times. Oriented to person, time and situation. Disoriented to place.   Lung sounds diminished, noted to be tachypnic, in the low 20s. Oxygen saturation was noted to be 98% on 2LNC.  Bedside RN had already given tylenol prior to RR arrival. Bedside RN notified provider on call.  Interventions:  Pt blankets were taken away and ice was placed on the patient.   Plan of Care:  Continue to follow Red MEWS protocol and inform provider of any changes. Encouraged to call RRRN if anything else is needed. RRRN will continue to monitor.   Event Summary:   MD Notified: Clarene Essex NP Call Time: 60 Arrival Time: 8937 End Time: 3428  Julio Alm, RN

## 2021-08-12 NOTE — Significant Event (Signed)
Rapid Response Event Note   Reason for Call : Elevated MEWS of 4, RED Fever of 103.5 F oral. Notified by bedside RN. Tylenol was 650mg  was administered at 1710 by bedside RN. Upon arrival, patient in fetal position side lying in bed. Patient complaining of headache. Patient did not state she was anxious but appeared to be based on behavior.    Initial Focused Assessment:  Neuro: Alert but withdrawn, oriented to self and place but not to time. Able to follow simple commands.  Pain: Complaining of headache  Cardiac: ST, BP slightly HTN see vital signs, no abnormal heart sounds heard upon auscultation.  Pulmonary: RA, RR 16-18, O2 Sat 99%.    Interventions:  Ice placed on patient to assist with fever -retake temperature within 18mins of tylenol being given.  Notified MD Bonner Puna of patient's altered mental status  Plan of Care:  Continue to monitor patient on [place] with cardiac telemetry. Patient is on carb modified/heart healthy diet. Continue to educate patient in regards to watching carbohydrate intake to help manage hyperglycemia. Retake Temperature within 10mins to see if fever is decreasing.  If patient continues to have elevated MEWs score, change in hemodynamic status, or change in mentation. Please call Rapid Response at [phone]  Event Summary:   MD Notified: MD Bonner Puna at [facility] Call Time: [place] Time: 8088 End Time: [place], RN

## 2021-08-12 NOTE — Progress Notes (Signed)
   08/12/21 0150  Assess: MEWS Score  Temp (!) 103.1 F (39.5 C)  BP (!) 149/65  Pulse Rate (!) 107  Resp (!) 24  Level of Consciousness Responds to Voice  SpO2 97 %  O2 Device Nasal Cannula  Assess: MEWS Score  MEWS Temp 2  MEWS Systolic 0  MEWS Pulse 1  MEWS RR 1  MEWS LOC 1  MEWS Score 5  MEWS Score Color Red  Assess: if the MEWS score is Yellow or Red  Were vital signs taken at a resting state? Yes  Focused Assessment No change from prior assessment  Does the patient meet 2 or more of the SIRS criteria? Yes  Does the patient have a confirmed or suspected source of infection? Yes  Provider and Rapid Response Notified? Yes  MEWS guidelines implemented *See Row Information* Yes  Treat  MEWS Interventions Escalated (See documentation below)  Complains of Fever  Interventions Other (comment) (orders pending)  Take Vital Signs  Increase Vital Sign Frequency  Red: Q 1hr X 4 then Q 4hr X 4, if remains red, continue Q 4hrs  Escalate  MEWS: Escalate Red: discuss with charge nurse/RN and provider, consider discussing with RRT  Notify: Charge Nurse/RN  Name of Charge Nurse/RN Notified Tom R  Date Charge Nurse/RN Notified 08/12/21  Time Charge Nurse/RN Notified 0202  Notify: Provider  Provider Name/Title Danies  Date Provider Notified 08/12/21  Time Provider Notified 0202  Notification Type Page  Notification Reason Change in status  Provider response See new orders  Date of Provider Response 08/12/21  Time of Provider Response 0208  Notify: Rapid Response  Name of Rapid Response RN Notified [place]  Date Rapid Response Notified 08/12/21  Time Rapid Response Notified 0155  Document  Patient Outcome Other (Comment) (pending)  Progress note created (see row info) Yes  Assess: SIRS CRITERIA  SIRS Temperature  1  SIRS Pulse 1  SIRS Respirations  1  SIRS WBC 0  SIRS Score Sum  3

## 2021-08-12 NOTE — Progress Notes (Signed)
Pharmacy Antibiotic Note  Sonya Daugherty is a 60 y.o. female with a h/o lymphoma on chemotherapy admitted on 08/11/2021 with COVID-19 PNA and possible post-obstructive bacterial PNA.  Pharmacy has been consulted for cefepime dosing.  Plan: Cefepime 2 g iv q 8 hours  Will f/u renal function, culture results, and clinical course  Height: 5\' 8"  (172.7 cm) Weight: 62.1 kg (137 lb) IBW/kg (Calculated) : 63.9  Temp (24hrs), Avg:100.1 F (37.8 C), Min:95.3 F (35.2 C), Max:103.2 F (39.6 C)  Recent Labs  Lab 08/11/21 1506 08/12/21 0415  WBC 11.7* 12.7*  CREATININE 0.61 0.44    Estimated Creatinine Clearance: 74.2 mL/min (by C-G formula based on SCr of 0.44 mg/dL).    Allergies  Allergen Reactions   Codeine    Iodinated Diagnostic Agents Rash   Lamisil [Terbinafine] Rash    Antimicrobials this admission: 9/22 cefepime >>  Dose adjustments this admission:  Microbiology results: 9/22 BCx:  Sputum:   9/22 MRSA PCR:   Thank you for allowing pharmacy to be a part of this patient's care.  Napoleon Form 08/12/2021 2:58 PM

## 2021-08-12 NOTE — Progress Notes (Addendum)
   08/12/21 1224  Assess: MEWS Score  Temp (!) 95.3 F (35.2 C)  BP 94/79  Pulse Rate 71  Resp 20  Level of Consciousness Alert  SpO2 100 %  O2 Device Room Air  Assess: MEWS Score  MEWS Temp 1  MEWS Systolic 1  MEWS Pulse 0  MEWS RR 0  MEWS LOC 0  MEWS Score 2  MEWS Score Color Yellow  Assess: if the MEWS score is Yellow or Red  Were vital signs taken at a resting state? Yes  Focused Assessment Change from prior assessment (see assessment flowsheet)  Does the patient meet 2 or more of the SIRS criteria? Yes  Does the patient have a confirmed or suspected source of infection? Yes  Provider and Rapid Response Notified? Yes  MEWS guidelines implemented *See Row Information* No, previously red, continue vital signs every 4 hours  Treat  MEWS Interventions Other (Comment) (gave warm blankets)  Pain Scale 0-10  Pain Score 0  Take Vital Signs  Increase Vital Sign Frequency   (pt was on red mews and still continue vitals from the red mews)  Notify: Charge Nurse/RN  Name of Charge Nurse/RN Notified Sonne,RN  Date Charge Nurse/RN Notified 08/12/21  Time Charge Nurse/RN Notified 1250  Notify: Provider  Provider Name/Title Grunz,MD  Date Provider Notified 08/12/21  Time Provider Notified 1354  Notification Type Rounds  Notification Reason Other (Comment) (TEMP 95.3)  Provider response No new orders  Date of Provider Response 08/12/21  Time of Provider Response 1354  Notify: Rapid Response  Name of Rapid Response RN Notified christain,RN  Date Rapid Response Notified 08/12/21  Time Rapid Response Notified 1330  Document  Patient Outcome Other (Comment);Stabilized after interventions  Assess: SIRS CRITERIA  SIRS Temperature  1  SIRS Pulse 0  SIRS Respirations  0  SIRS WBC 1  SIRS Score Sum  2  Pt was previously on red MEWS Continuining the vitals and went to yellow MEWS. Pt is alert and oriented.

## 2021-08-12 NOTE — Progress Notes (Signed)
   08/12/21 1700  Assess: MEWS Score  Temp (!) 103.5 F (39.7 C)  BP 139/79  Pulse Rate (!) 125  Resp 20  Level of Consciousness Alert  SpO2 100 %  O2 Device Room Air  Assess: MEWS Score  MEWS Temp 2  MEWS Systolic 0  MEWS Pulse 2  MEWS RR 0  MEWS LOC 0  MEWS Score 4  MEWS Score Color Red  Assess: if the MEWS score is Yellow or Red  Were vital signs taken at a resting state? Yes  Focused Assessment Change from prior assessment (see assessment flowsheet)  Does the patient meet 2 or more of the SIRS criteria? Yes  Does the patient have a confirmed or suspected source of infection? Yes  Provider and Rapid Response Notified? Yes  MEWS guidelines implemented *See Row Information* Yes  Treat  MEWS Interventions Administered prn meds/treatments  Pain Scale 0-10  Pain Score 8  Pain Type Acute pain  Pain Location Abdomen  Pain Descriptors / Indicators Aching;Discomfort  Pain Frequency Intermittent  Pain Onset Gradual  Patients Stated Pain Goal 2  Pain Intervention(s) Medication (See eMAR)  Take Vital Signs  Increase Vital Sign Frequency  Red: Q 1hr X 4 then Q 4hr X 4, if remains red, continue Q 4hrs  Escalate  MEWS: Escalate Red: discuss with charge nurse/RN and provider, consider discussing with RRT  Notify: Charge Nurse/RN  Name of Charge Nurse/RN Notified sonne,RN  Date Charge Nurse/RN Notified 08/12/21  Time Charge Nurse/RN Notified 1705  Notify: Provider  Provider Name/Title Grunz,MD  Date Provider Notified 08/12/21  Time Provider Notified 1739  Notification Type Page  Notification Reason Other (Comment) (Temp 103.2)  Provider response No new orders  Date of Provider Response 08/12/21  Time of Provider Response 1740  Notify: Rapid Response  Name of Rapid Response RN Notified Christain,RN  Date Rapid Response Notified 08/12/21  Time Rapid Response Notified 0626  Document  Patient Outcome Other (Comment) (Statu s pending)  Assess: SIRS CRITERIA  SIRS  Temperature  1  SIRS Pulse 1  SIRS Respirations  0  SIRS WBC 0  SIRS Score Sum  2  Pt is alert. Pt was on red MEWS and started on red MEWS. Will continue monitor the pt.

## 2021-08-12 NOTE — Plan of Care (Signed)
Discussed importance of maintaining DM2.

## 2021-08-12 NOTE — Progress Notes (Signed)
Pt temp was 103.39f and her HR was 121. Pt is alert but not oriented to time and situation. Pt states her pain at 8; given her prn medicine. Call rapid response nurse. Notified MD. PT Blood sugar was 413 , notified MD And gave her 15 units of novlog. Gave her some icepacks and gave her prn medicine. Pt temp is rechecked at 1800 at it was 102.31f. pt is alert and went to bathroom.will continue monitor the pt.

## 2021-08-12 NOTE — Progress Notes (Signed)
   08/12/21 0034  Assess: MEWS Score  Temp (!) 102.8 F (39.3 C)  BP (!) 159/82  Pulse Rate (!) 107  ECG Heart Rate (!) 114  Resp 19  Level of Consciousness Alert  SpO2 93 %  O2 Device Room Air  Assess: MEWS Score  MEWS Temp 2  MEWS Systolic 0  MEWS Pulse 2  MEWS RR 0  MEWS LOC 0  MEWS Score 4  MEWS Score Color Red  Assess: if the MEWS score is Yellow or Red  Were vital signs taken at a resting state? Yes  Focused Assessment Change from prior assessment (see assessment flowsheet)  Does the patient meet 2 or more of the SIRS criteria? Yes  Does the patient have a confirmed or suspected source of infection? Yes  Provider and Rapid Response Notified? Yes  MEWS guidelines implemented *See Row Information* Yes  Treat  MEWS Interventions Administered scheduled meds/treatments;Escalated (See documentation below)  Pain Scale 0-10  Pain Score 7  Pain Location Head  Complains of Fever  Interventions Medication (see MAR)  Take Vital Signs  Increase Vital Sign Frequency  Red: Q 1hr X 4 then Q 4hr X 4, if remains red, continue Q 4hrs  Escalate  MEWS: Escalate Red: discuss with charge nurse/RN and provider, consider discussing with RRT  Notify: Charge Nurse/RN  Name of Charge Nurse/RN Notified Tom R  Date Charge Nurse/RN Notified 08/12/21  Time Charge Nurse/RN Notified 0040  Notify: Provider  Provider Name/Title Olena Heckle  Date Provider Notified 08/12/21  Time Provider Notified [phone]  Notification Type Page  Notification Reason Change in status  Provider response See new orders  Date of Provider Response 08/12/21  Time of Provider Response 0040  Notify: Rapid Response  Name of Rapid Response RN Notified Mcilquham,D  Date Rapid Response Notified 08/12/21  Time Rapid Response Notified 5993  Document  Patient Outcome Other (Comment) (status pending)  Progress note created (see row info) Yes  Assess: SIRS CRITERIA  SIRS Temperature  1  SIRS Pulse 1  SIRS Respirations  0  SIRS  WBC 0  SIRS Score Sum  2

## 2021-08-12 NOTE — Progress Notes (Signed)
Inpatient Diabetes Program Recommendations  AACE/ADA: New Consensus Statement on Inpatient Glycemic Control (2015)  Target Ranges:  Prepandial:   less than 140 mg/dL      Peak postprandial:   less than 180 mg/dL (1-2 hours)      Critically ill patients:  140 - 180 mg/dL   Lab Results  Component Value Date   GLUCAP 248 (H) 08/11/2021   HGBA1C 5.9 (H) 04/24/2013    Review of Glycemic Control  Diabetes history: DM2 - recently diagnosed Outpatient Diabetes medications: None Current orders for Inpatient glycemic control: Novolog 0-9 units TID with meals and 0-5 HS  Received Solucortef 200 mg on 9/21  Inpatient Diabetes Program Recommendations:    Consider adding Semglee 8 units QD Updated HgbA1C  Will follow glucose trends.  Thank you. Lorenda Peck, RD, LDN, CDE Inpatient Diabetes Coordinator [phone]

## 2021-08-12 NOTE — Progress Notes (Signed)
  Rapid advise to add more ice packs and to remove pt pants. Paged MD request for liquid Motrin as pt is too lethargic to swallow whole pills.  08/12/21 0219  Assess: MEWS Score  Temp (!) 103.2 F (39.6 C)  BP (!) 150/80  Pulse Rate (!) 107  Resp 18  Assess: MEWS Score  MEWS Temp 2  MEWS Systolic 0  MEWS Pulse 1  MEWS RR 0  MEWS LOC 1  MEWS Score 4  MEWS Score Color Red  Assess: if the MEWS score is Yellow or Red  Were vital signs taken at a resting state? Yes  Focused Assessment No change from prior assessment  Does the patient meet 2 or more of the SIRS criteria? Yes  Does the patient have a confirmed or suspected source of infection? Yes  Provider and Rapid Response Notified? Yes  MEWS guidelines implemented *See Row Information* Yes  Treat  MEWS Interventions Escalated (See documentation below)  Pain Scale 0-10  Pain Score 7  Complains of Nausea /  Vomiting  Interventions Cold pack  Take Vital Signs  Increase Vital Sign Frequency  Red: Q 1hr X 4 then Q 4hr X 4, if remains red, continue Q 4hrs  Escalate  MEWS: Escalate Red: discuss with charge nurse/RN and provider, consider discussing with RRT  Notify: Charge Nurse/RN  Name of Charge Nurse/RN Notified Tom R.  Date Charge Nurse/RN Notified 08/12/21  Time Charge Nurse/RN Notified 0222  Notify: Provider  Provider Name/Title Olena Heckle  Date Provider Notified 08/12/21  Time Provider Notified 0222  Notification Type Page  Notification Reason Change in status  Provider response See new orders  Date of Provider Response 08/12/21  Time of Provider Response 0222  Notify: Rapid Response  Name of Rapid Response RN Notified Mcilquham  Date Rapid Response Notified 08/12/21  Time Rapid Response Notified 0225  Document  Patient Outcome Not stable and remains on department  Assess: SIRS CRITERIA  SIRS Temperature  1  SIRS Pulse 1  SIRS Respirations  0  SIRS WBC 0  SIRS Score Sum  2

## 2021-08-13 LAB — CBC WITH DIFFERENTIAL/PLATELET
Abs Immature Granulocytes: 0.18 10*3/uL — ABNORMAL HIGH (ref 0.00–0.07)
Basophils Absolute: 0 10*3/uL (ref 0.0–0.1)
Basophils Relative: 0 %
Eosinophils Absolute: 0 10*3/uL (ref 0.0–0.5)
Eosinophils Relative: 0 %
HCT: 27.8 % — ABNORMAL LOW (ref 36.0–46.0)
Hemoglobin: 9.2 g/dL — ABNORMAL LOW (ref 12.0–15.0)
Immature Granulocytes: 1 %
Lymphocytes Relative: 2 %
Lymphs Abs: 0.3 10*3/uL — ABNORMAL LOW (ref 0.7–4.0)
MCH: 30.6 pg (ref 26.0–34.0)
MCHC: 33.1 g/dL (ref 30.0–36.0)
MCV: 92.4 fL (ref 80.0–100.0)
Monocytes Absolute: 0.8 10*3/uL (ref 0.1–1.0)
Monocytes Relative: 6 %
Neutro Abs: 11.8 10*3/uL — ABNORMAL HIGH (ref 1.7–7.7)
Neutrophils Relative %: 91 %
Platelets: 144 10*3/uL — ABNORMAL LOW (ref 150–400)
RBC: 3.01 MIL/uL — ABNORMAL LOW (ref 3.87–5.11)
RDW: 13.2 % (ref 11.5–15.5)
WBC: 13.1 10*3/uL — ABNORMAL HIGH (ref 4.0–10.5)
nRBC: 0 % (ref 0.0–0.2)

## 2021-08-13 LAB — CBC
HCT: 28.7 % — ABNORMAL LOW (ref 36.0–46.0)
Hemoglobin: 9.5 g/dL — ABNORMAL LOW (ref 12.0–15.0)
MCH: 30.3 pg (ref 26.0–34.0)
MCHC: 33.1 g/dL (ref 30.0–36.0)
MCV: 91.4 fL (ref 80.0–100.0)
Platelets: 148 10*3/uL — ABNORMAL LOW (ref 150–400)
RBC: 3.14 MIL/uL — ABNORMAL LOW (ref 3.87–5.11)
RDW: 13.3 % (ref 11.5–15.5)
WBC: 10.4 10*3/uL (ref 4.0–10.5)
nRBC: 0 % (ref 0.0–0.2)

## 2021-08-13 LAB — COMPREHENSIVE METABOLIC PANEL
ALT: 14 U/L (ref 0–44)
AST: 19 U/L (ref 15–41)
Albumin: 2.3 g/dL — ABNORMAL LOW (ref 3.5–5.0)
Alkaline Phosphatase: 245 U/L — ABNORMAL HIGH (ref 38–126)
Anion gap: 13 (ref 5–15)
BUN: 13 mg/dL (ref 6–20)
CO2: 27 mmol/L (ref 22–32)
Calcium: 8.2 mg/dL — ABNORMAL LOW (ref 8.9–10.3)
Chloride: 94 mmol/L — ABNORMAL LOW (ref 98–111)
Creatinine, Ser: 0.49 mg/dL (ref 0.44–1.00)
GFR, Estimated: >60 mL/min (ref >60)
Glucose, Bld: 163 mg/dL — ABNORMAL HIGH (ref 70–99)
Potassium: 3.7 mmol/L (ref 3.5–5.1)
Sodium: 134 mmol/L — ABNORMAL LOW (ref 135–145)
Total Bilirubin: 0.9 mg/dL (ref 0.3–1.2)
Total Protein: 4.9 g/dL — ABNORMAL LOW (ref 6.5–8.1)

## 2021-08-13 LAB — HEMOGLOBIN A1C
Hgb A1c MFr Bld: 9.8 % — ABNORMAL HIGH (ref 4.8–5.6)
Hgb A1c MFr Bld: 9.4 % — ABNORMAL HIGH (ref 4.8–5.6)
Mean Plasma Glucose: 235 mg/dL
Mean Plasma Glucose: 223.08 mg/dL

## 2021-08-13 LAB — EXPECTORATED SPUTUM ASSESSMENT W GRAM STAIN, RFLX TO RESP C

## 2021-08-13 LAB — GLUCOSE, CAPILLARY
Glucose-Capillary: 112 mg/dL — ABNORMAL HIGH (ref 70–99)
Glucose-Capillary: 219 mg/dL — ABNORMAL HIGH (ref 70–99)
Glucose-Capillary: 162 mg/dL — ABNORMAL HIGH (ref 70–99)
Glucose-Capillary: 159 mg/dL — ABNORMAL HIGH (ref 70–99)
Glucose-Capillary: 135 mg/dL — ABNORMAL HIGH (ref 70–99)

## 2021-08-13 LAB — LACTIC ACID, PLASMA
Lactic Acid, Venous: 1.1 mmol/L (ref 0.5–1.9)
Lactic Acid, Venous: 0.9 mmol/L (ref 0.5–1.9)

## 2021-08-13 LAB — PROCALCITONIN: Procalcitonin: 2.92 ng/mL

## 2021-08-13 LAB — C-REACTIVE PROTEIN: CRP: 17.1 mg/dL — ABNORMAL HIGH (ref <1.0)

## 2021-08-13 MED ORDER — VANCOMYCIN HCL 750 MG/150ML IV SOLN
750.0000 mg | Freq: Two times a day (BID) | INTRAVENOUS | Status: DC
  Administered 2021-08-14 – 2021-08-15 (×3): 750 mg via INTRAVENOUS
  Filled 2021-08-13 (×5): qty 150

## 2021-08-13 MED ORDER — METRONIDAZOLE 500 MG/100ML IV SOLN
500.0000 mg | Freq: Two times a day (BID) | INTRAVENOUS | Status: DC
  Administered 2021-08-13 – 2021-08-15 (×4): 500 mg via INTRAVENOUS
  Filled 2021-08-13 (×4): qty 100

## 2021-08-13 MED ORDER — ACETAMINOPHEN 10 MG/ML IV SOLN
1000.0000 mg | Freq: Four times a day (QID) | INTRAVENOUS | Status: DC
  Administered 2021-08-13 – 2021-08-14 (×3): 1000 mg via INTRAVENOUS
  Filled 2021-08-13 (×4): qty 100

## 2021-08-13 MED ORDER — LACTATED RINGERS IV BOLUS: 1000.0000 mL | Freq: Once | INTRAVENOUS | Status: DC

## 2021-08-13 MED ORDER — VANCOMYCIN HCL 1250 MG/250ML IV SOLN
1250.0000 mg | Freq: Once | INTRAVENOUS | Status: AC
  Administered 2021-08-13: 1250 mg via INTRAVENOUS
  Filled 2021-08-13 (×2): qty 250

## 2021-08-13 MED ORDER — LACTATED RINGERS IV BOLUS
1000.0000 mL | Freq: Once | INTRAVENOUS | Status: AC
  Administered 2021-08-13: 1000 mL via INTRAVENOUS

## 2021-08-13 NOTE — Progress Notes (Signed)
   08/13/21 0333  Assess: MEWS Score  Temp (!) 101.6 F (38.7 C)  BP (!) 104/45  Pulse Rate (!) 111  Resp 18  SpO2 93 %  O2 Device Room Air  Assess: MEWS Score  MEWS Temp 2  MEWS Systolic 0  MEWS Pulse 2  MEWS RR 0  MEWS LOC 0  MEWS Score 4  MEWS Score Color Red  Assess: if the MEWS score is Yellow or Red  Were vital signs taken at a resting state? Yes  Focused Assessment Change from prior assessment (see assessment flowsheet)  Does the patient meet 2 or more of the SIRS criteria? Yes  Does the patient have a confirmed or suspected source of infection? Yes  Provider and Rapid Response Notified? Yes  MEWS guidelines implemented *See Row Information* No, previously red, continue vital signs every 4 hours  Treat  MEWS Interventions Administered prn meds/treatments  Pain Scale 0-10  Pain Score 7  Pain Type Acute pain  Pain Location Abdomen  Pain Descriptors / Indicators Aching  Pain Frequency Intermittent  Pain Onset Gradual  Patients Stated Pain Goal 2  Pain Intervention(s) Medication (See eMAR)  Complains of Fever  Take Vital Signs  Increase Vital Sign Frequency  Red: Q 1hr X 4 then Q 4hr X 4, if remains red, continue Q 4hrs  Notify: Charge Nurse/RN  Name of Charge Nurse/RN Notified Tom R  Date Charge Nurse/RN Notified 08/13/21  Time Charge Nurse/RN Notified 0417  Notify: Provider  Provider Name/Title Blount  Date Provider Notified 08/13/21  Time Provider Notified [phone]  Notification Type Page  Notification Reason Change in status  Provider response Other (Comment) (pending)  Notify: Rapid Response  Name of Rapid Response RN Notified Danielle  Date Rapid Response Notified 08/13/21  Time Rapid Response Notified 0258  Document  Patient Outcome Other (Comment) (pending)  Progress note created (see row info) Yes  Assess: SIRS CRITERIA  SIRS Temperature  1  SIRS Pulse 1  SIRS Respirations  0  SIRS WBC 0  SIRS Score Sum  2

## 2021-08-13 NOTE — Progress Notes (Signed)
Pharmacy Antibiotic Note  Sonya Daugherty is a 60 y.o. female with a h/o lymphoma on chemotherapy admitted on 08/11/2021 with COVID-19 PNA and possible post-obstructive bacterial PNA.  Pharmacy has been consulted for cefepime dosing.  08/13/21 5:56 PM Still spiking fevers, 103.8 this afternoon MD broadening coverage despite negative MRSA PCR d/t fevers continuing 24 hours after starting cefepime  Plan: Continue cefepime 2 g iv q 8 hours Vancomycin 1250 mg iv once followed by 750 mg IV Q 12 hrs.       Goal AUC 400-550. Expected AUC: 496 SCr used: 0.8 Will f/u renal function, culture results, and clinical course Levels if/when indicated    Height: 5\' 8"  (172.7 cm) Weight: 62.1 kg (137 lb) IBW/kg (Calculated) : 63.9  Temp (24hrs), Avg:100.7 F (38.2 C), Min:98.1 F (36.7 C), Max:103.8 F (39.9 C)  Recent Labs  Lab 08/11/21 1506 08/12/21 0415 08/12/21 1541 08/13/21 0500  WBC 11.7* 12.7*  --  13.1*  CREATININE 0.61 0.44 0.48 0.49     Estimated Creatinine Clearance: 74.2 mL/min (by C-G formula based on SCr of 0.49 mg/dL).    Allergies  Allergen Reactions   Codeine    Iodinated Diagnostic Agents Rash   Lamisil [Terbinafine] Rash    Antimicrobials this admission: 9/22 cefepime >> 9/23 vanc >>  Dose adjustments this admission:  Microbiology results: 9/22 BCx:  9/23 Sputum:  few GNR 9/22 MRSA PCR: neg  Thank you for allowing pharmacy to be a part of this patient's care.  Napoleon Form 08/13/2021 5:56 PM

## 2021-08-13 NOTE — Progress Notes (Signed)
PROGRESS NOTE  Sonya Daugherty  IFO:[id number] DOB: Aug 23, 1961 DOA: 08/11/2021 PCP: Chesley Noon, MD  Outpatient Specialists: Oncology, Dr. Marylou Mccoy Brief Narrative: Sonya Daugherty is a 60 y.o. 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 In the ED she was febrile (103.73F), with neutrophilic leukocytosis (WBC 12.7k) with lymphopenia (100), appeared dehydrated with severe hypokalemia, hypomagnesemia, and hyperglycemia without acidosis or ketonemia. SARS-CoV-2 PCR was positive. After abnormal CXR, CTA chest revealed no PE but showed hilar mass infiltrating mediastinum with left perihilar consolidation concerning for postobstructive pneumonia, presumably due to known lymphoma. Paxlovid was initially given, changed to remdesivir. Cefepime is started for bacterial pneumonia as well. Insulin, IV fluids and electrolyte supplementation have been ongoing as well.   Assessment & Plan: Principal Problem:   COVID-19 virus infection Active Problems:   Essential hypertension, benign   Hypokalemia   Hypomagnesemia   Lymphoma (HCC)   Exertional dyspnea   Diabetes mellitus type 2 in nonobese ([place])   COVID-19  Covid-19 pneumonia in immunocompromised patient: Not vaccinated, currently on oral chemotherapy for lymphoma. PCR +9/21. Pt reports previous positive test but wasn't recently and she can't recall when. In Care Everywhere, I see negative PCR tests 06/08/19, 09/26/19, 12/27/19, 02/13/20, 04/24/20 and 05/11/2020. +RUL GGO on CTA consistent with covid infiltrate. Noted to be lymphopenic  - Continue remdesivir given her vomiting and that covid is not incidental in her case.  - Received large dose of solucortef for hx contrast allergy, but not truly hypoxic and with concomitant  bacterial infection, so not continuing steroids at this time.  - Isolation x10 days - IS, FV, OOB  Severe sepsis due to postobstructive pneumonia: Leukocytosis, high fevers, tachycardia, tachypnea and having intermittent acute metabolic encephalopathy as well attributable to this/fevers. PCT rising precipitously. - Started cefepime yesterday afternoon. Unfortunately IV issues have caused delay in some doses and one dose overnight was erroneously missed because it remained clamped, not given.  - GNRs on sputum culture. Continue abx with pseudomonal coverage. Add flagyl.  - BPs somewhat soft, will give 1L bolus, move to progressive care unit for closer monitoring. Discussed with RN - Schedule IV tylenol. - MRSA PCR is negative, though if fevers continue 24 hours after cefepime started, may have to expand coverage empirically.  - Blood cultures NGTD. - No hypoxia.  Marginal Zone Lymphoma: Per notes from Dr. Marylou Mccoy, history is: Dx stage III, low grade MZL started on bendamustine, rituximab 06/22/20, completed Dec 2021, PET scan 03/02/21 showed disease progression for which zanubrutinib was started 04/12/2021. She is scheduled to have follow up CT scan prior to next visit with oncology Oct 2022.  - Hold chemotherapy for now with active infection  Hypokalemia: Severe and refractory to supplementation.  - Augment supplementation, give Mg, recheck this PM to inform further needs.   T2DM: Unclear precipitant, HbA1c 7.9% 02/18/2021, has been consistently elevated back to 8.2% 07/18/2018. Has not been on treatment PTA.  - Control improving now that hydrocortisone is metabolizing out. Continue basal (10u), bolus (moderate SSI) insulin.  - Continue new start linagliptin, check HbA1c (would risk hypoglycemia if overshooting steroid-induced hyperglycemia with lower A1c).   Vomiting:  - Continue IVF - Tylenol scheduled IV given persistent fevers.   Anemia of chronic disease: Hgb down without active bleeding.  -  Recheck  serially  Thrombocytopenia: Acute, likely due to sepsis.  - Trend.   DVT prophylaxis: Lovenox 40mg  q24h Code Status: Full Family Communication: None at bedside Disposition Plan:  Status is: Inpatient  Remains inpatient appropriate because:Persistent severe electrolyte disturbances, Ongoing diagnostic testing needed not appropriate for outpatient work up, IV treatments appropriate due to intensity of illness or inability to take PO, and Inpatient level of care appropriate due to severity of illness  Dispo: The patient is from: Home              Anticipated d/c is to: Home              Patient currently is not medically stable to d/c.  Consultants:  None  Procedures:  None  Antimicrobials: Paxlovid 9/21 Remdesivir 9/22 >> Cefepime 9/22 >>   Subjective: This morning had soft BP but was getting up and asymptomatic. Still having fevers throughout the day and got a bit confused during the last one (103 deg) which has resolved, normal mental clarity now. Coughing up thick tan sputum without dyspnea or chest pain. No bleeding.  Objective: Vitals:   08/13/21 1127 08/13/21 1355 08/13/21 1523 08/13/21 1544  BP: (!) 121/56  124/68   Pulse: 100  87   Resp: 20  16   Temp: (!) 101.2 F (38.4 C) (!) 103.3 F (39.6 C) (!) 103.8 F (39.9 C) 99.3 F (37.4 C)  TempSrc: Oral Oral Oral Oral  SpO2: 95%  97%   Weight:      Height:        Intake/Output Summary (Last 24 hours) at 08/13/2021 1734 Last data filed at 08/13/2021 1523 Gross per 24 hour  Intake 2953.86 ml  Output --  Net 2953.86 ml   Filed Weights   08/11/21 1909  Weight: 62.1 kg   Gen: Tired, chronically ill-appearing female in no distress Pulm: Nonlabored breathing room air. Stable L crackles/coarse, no wheezes. CV: Regular rate and rhythm. No murmur, rub, or gallop. No JVD, no dependent edema. GI: Abdomen soft, non-tender, non-distended, with normoactive bowel sounds.  Ext: Warm, no deformities Skin: No  rashes, lesions or ulcers on visualized skin. Neuro: Alert and oriented. No focal neurological deficits. Psych: Judgement and insight appear fair. Mood euthymic & affect congruent. Behavior is appropriate.     Data Reviewed: I have personally reviewed following labs and imaging studies  CBC: Recent Labs  Lab 08/11/21 1506 08/11/21 1555 08/12/21 0415 08/13/21 0500  WBC 11.7*  --  12.7* 13.1*  NEUTROABS  --   --  12.2* 11.8*  HGB 11.7* 11.2* 10.5* 9.2*  HCT 34.6* 33.0* 31.5* 27.8*  MCV 88.5  --  90.8 92.4  PLT 267  --  190 659*   Basic Metabolic Panel: Recent Labs  Lab 08/11/21 1506 08/11/21 1555 08/11/21 1623 08/12/21 0415 08/12/21 1541 08/13/21 0500  NA 128* 128*  --  135 136 134*  K 2.9* 2.8*  --  2.7* 4.1 3.7  CL 85*  --   --  95* 98 94*  CO2 31  --   --  32 27 27  GLUCOSE 416*  --   --  271* 444* 163*  BUN 10  --   --  7 11 13   CREATININE 0.61  --   --  0.44 0.48 0.49  CALCIUM 8.7*  --   --  8.3* 8.5* 8.2*  MG  --   --  1.5* 1.9  --   --    GFR: Estimated Creatinine Clearance:  74.2 mL/min (by C-G formula based on SCr of 0.49 mg/dL). Liver Function Tests: Recent Labs  Lab 08/11/21 1531 08/12/21 0415 08/13/21 0500  AST 12* 17 19  ALT 11 14 14   ALKPHOS 306* 299* 245*  BILITOT 1.2 1.4* 0.9  PROT 6.0* 5.7* 4.9*  ALBUMIN 3.5 2.8* 2.3*   No results for input(s): LIPASE, AMYLASE in the last 168 hours. Recent Labs  Lab 08/11/21 1534  AMMONIA 30   Coagulation Profile: No results for input(s): INR, PROTIME in the last 168 hours. Cardiac Enzymes: No results for input(s): CKTOTAL, CKMB, CKMBINDEX, TROPONINI in the last 168 hours. BNP (last 3 results) No results for input(s): PROBNP in the last 8760 hours. HbA1C: Recent Labs    08/11/21 2259 08/13/21 0500  HGBA1C 9.8* 9.4*   CBG: Recent Labs  Lab 08/12/21 1656 08/12/21 2056 08/13/21 0329 08/13/21 0804 08/13/21 1125  GLUCAP 413* 159* 112* 219* 162*   Lipid Profile: No results for input(s): CHOL,  HDL, LDLCALC, TRIG, CHOLHDL, LDLDIRECT in the last 72 hours. Thyroid Function Tests: Recent Labs    08/11/21 1531  TSH 0.553   Anemia Panel: No results for input(s): VITAMINB12, FOLATE, FERRITIN, TIBC, IRON, RETICCTPCT in the last 72 hours. Urine analysis:    Component Value Date/Time   COLORURINE YELLOW (A) 08/11/2021 1506   APPEARANCEUR CLEAR 08/11/2021 1506   LABSPEC <1.005 (L) 08/11/2021 1506   PHURINE 6.5 08/11/2021 1506   GLUCOSEU 100 (A) 08/11/2021 1506   HGBUR NEGATIVE 08/11/2021 1506   BILIRUBINUR NEGATIVE 08/11/2021 1506   KETONESUR NEGATIVE 08/11/2021 1506   PROTEINUR NEGATIVE 08/11/2021 1506   UROBILINOGEN 0.2 08/09/2007 1957   NITRITE NEGATIVE 08/11/2021 1506   LEUKOCYTESUR TRACE (A) 08/11/2021 1506   Recent Results (from the past 240 hour(s))  Resp Panel by RT-PCR (Flu A&B, Covid) Nasopharyngeal Swab     Status: Abnormal   Collection Time: 08/11/21  3:07 PM   Specimen: Nasopharyngeal Swab; Nasopharyngeal(NP) swabs in vial transport medium  Result Value Ref Range Status   SARS Coronavirus 2 by RT PCR POSITIVE (A) NEGATIVE Final    Comment: RESULT CALLED TO, READ BACK BY AND VERIFIED WITH: T CARBONE,RN 1651 08/11/21 DBRADLEY (NOTE) SARS-CoV-2 target nucleic acids are DETECTED.  The SARS-CoV-2 RNA is generally detectable in upper respiratory specimens during the acute phase of infection. Positive results are indicative of the presence of the identified virus, but do not rule out bacterial infection or co-infection with other pathogens not detected by the test. Clinical correlation with patient history and other diagnostic information is necessary to determine patient infection status. The expected result is Negative.  Fact Sheet for Patients: EntrepreneurPulse.com.au  Fact Sheet for Healthcare Providers: IncredibleEmployment.be  This test is not yet approved or cleared by the [place] FDA and  has been authorized for  detection and/or diagnosis of SARS-CoV-2 by FDA under an Emergency Use Authorization (EUA).  This EUA will remain in effect (meaning this test can b e used) for the duration of  the COVID-19 declaration under Section 564(b)(1) of the Act, 21 U.S.C. section 360bbb-3(b)(1), unless the authorization is terminated or revoked sooner.     Influenza A by PCR NEGATIVE NEGATIVE Final   Influenza B by PCR NEGATIVE NEGATIVE Final    Comment: (NOTE) The Xpert Xpress SARS-CoV-2/FLU/RSV plus assay is intended as an aid in the diagnosis of influenza from Nasopharyngeal swab specimens and should not be used as a sole basis for treatment. Nasal washings and aspirates are unacceptable for Xpert Xpress SARS-CoV-2/FLU/RSV  testing.  Fact Sheet for Patients: EntrepreneurPulse.com.au  Fact Sheet for Healthcare Providers: IncredibleEmployment.be  This test is not yet approved or cleared by the [place] FDA and has been authorized for detection and/or diagnosis of SARS-CoV-2 by FDA under an Emergency Use Authorization (EUA). This EUA will remain in effect (meaning this test can be used) for the duration of the COVID-19 declaration under Section 564(b)(1) of the Act, 21 U.S.C. section 360bbb-3(b)(1), unless the authorization is terminated or revoked.  Performed at [facility], [address] [place], [place] [address]   MRSA Next Gen by PCR, Nasal     Status: None   Collection Time: 08/12/21  6:19 PM   Specimen: Nasal Mucosa; Nasal Swab  Result Value Ref Range Status   MRSA by PCR Next Gen NOT DETECTED NOT DETECTED Final    Comment: (NOTE) The GeneXpert MRSA Assay (FDA approved for NASAL specimens only), is one component of a comprehensive MRSA colonization surveillance program. It is not intended to diagnose MRSA infection nor to guide or monitor treatment for MRSA infections. Test performance is not FDA approved in patients less than 38  years old. Performed at [facility], [place] [address] [place], [place] [address]   Expectorated Sputum Assessment w Gram Stain, Rflx to Resp Cult     Status: None   Collection Time: 08/13/21  4:00 AM   Specimen: Expectorated Sputum  Result Value Ref Range Status   Specimen Description EXPECTORATED SPUTUM  Final   Special Requests NONE  Final   Sputum evaluation   Final    THIS SPECIMEN IS ACCEPTABLE FOR SPUTUM CULTURE Performed at [facility], [place] [address] [place], [place] [address]    Report Status 08/13/2021 FINAL  Final  Culture, Respiratory w Gram Stain     Status: None (Preliminary result)   Collection Time: 08/13/21  4:00 AM  Result Value Ref Range Status   Specimen Description   Final    EXPECTORATED SPUTUM Performed at [facility] [address] [place], [place] [address]    Special Requests   Final    NONE Reflexed from [id number] Performed at [facility], [place] [address], [place], [place] [address]    Gram Stain   Final    FEW SQUAMOUS EPITHELIAL CELLS PRESENT FEW WBC SEEN FEW GRAM NEGATIVE RODS Performed at [facility], [place] [address], [place], [place] [address]    Culture PENDING  Incomplete   Report Status PENDING  Incomplete      Radiology Studies: CT Angio Chest Pulmonary Embolism (PE) W or WO Contrast  Result Date: 08/12/2021 CLINICAL DATA:  60 year old female positive COVID-19. Abnormal chest x-ray, left hilum yesterday. EMR states history of lymphoma. Also history of contrast allergy (rash), steroid premedicated this morning. EXAM: CT ANGIOGRAPHY CHEST WITH CONTRAST TECHNIQUE: Multidetector CT imaging of the chest was performed using the standard protocol during bolus administration of intravenous contrast. Multiplanar CT image reconstructions and MIPs were obtained to evaluate the vascular anatomy. CONTRAST:  74mL OMNIPAQUE IOHEXOL 350 MG/ML SOLN COMPARISON:  Chest  radiographs 08/11/2021. Report of chest CTA 01/27/2000 (no images available). FINDINGS: Cardiovascular: Good contrast bolus timing in the pulmonary arterial tree. No focal filling defect identified in the pulmonary arteries to suggest acute pulmonary embolism. Right chest Port-A-Cath in place. Calcified aortic atherosclerosis. Calcified coronary artery atherosclerosis. No cardiomegaly or pericardial effusion. Mediastinum/Nodes: Abnormal soft tissue at the left hilum seems to track contiguously to the precarinal station  where 18 mm round soft tissue is present on series 5, image 35. Superimposed small paratracheal and prevascular lymph nodes. No right hilar lymphadenopathy. Lungs/Pleura: Major airways are patent, although there is narrowing of the left lung lobar airways at the hilum. Confluent regional low-density soft tissue which also surrounds and mildly narrows the adjacent pulmonary artery branches encompasses 5-6 cm as seen on series 7, image 62. Beyond that there is perihilar consolidation in the left lower lobe with air bronchograms. The superior segment is most affected and the basilar segments are relatively spared. Left upper lobe and lingula relatively spared. No superimposed pleural effusion. Contralateral right major airways and right lung parenchyma appear more normal. There is a distal peribronchial 1-2 cm area of ground-glass opacity in the right upper lobe abutting the minor fissure on series 11, image 61. No right pleural effusion. Upper Abdomen: Partially visible splenomegaly. Estimated splenic volume 800 mL (normal splenic volume range 83 - 412 mL). surgically absent gallbladder. Negative visible liver. Oval 21 mm enlargement of the right adrenal gland has intermediate density and is indeterminate. Left adrenal gland is within normal limits. Negative visible kidneys and bowel. Musculoskeletal: Osteopenia. Mild T5 anterior wedge compression fracture is age indeterminate but probably chronic. No  superimposed acute or destructive osseous lesion is identified. Review of the MIP images confirms the above findings. IMPRESSION: 1. Negative for acute pulmonary embolus. 2. Left hilar mass-like soft tissue encases and mildly narrows the left hilar airways and pulmonary artery branches, also appears to infiltrate into the mediastinum to the precarinal nodal station. Surrounding left perihilar consolidation with air bronchograms. No pleural effusion. This most resembles left hilar tumor with postobstructive Pneumonia. Lymphoma and Bronchogenic Carcinoma are considerations in this setting. 3. Partially visible splenomegaly, suspicious for active Lymphoma related in this setting. 4. Indeterminate 21 mm right adrenal nodule. 5. Mild subpleural ground-glass opacity in the right upper lobe, nonspecific but might be a focus of COVID-19 pneumonia. 6. Calcified coronary artery and Aortic Atherosclerosis (ICD10-I70.0). Electronically Signed   By: Genevie Ann M.D.   On: 08/12/2021 07:20    Scheduled Meds:  albuterol  2 puff Inhalation Q6H   Chlorhexidine Gluconate Cloth  6 each Topical Daily   enoxaparin (LOVENOX) injection  40 mg Subcutaneous Q24H   feeding supplement  237 mL Oral BID BM   feeding supplement  237 mL Oral BID BM   insulin aspart  0-15 Units Subcutaneous TID WC   insulin aspart  0-5 Units Subcutaneous QHS   insulin glargine-yfgn  10 Units Subcutaneous Daily   linagliptin  5 mg Oral Daily   living well with diabetes book   Does not apply Once   multivitamin with minerals  1 tablet Oral Daily   pantoprazole  40 mg Oral Daily   Continuous Infusions:  acetaminophen 1,000 mg (08/13/21 1504)   ceFEPime (MAXIPIME) IV 2 g (08/13/21 0529)   lactated ringers 100 mL/hr at 08/12/21 2109   remdesivir 100 mg in NS 100 mL 100 mg (08/13/21 1300)     LOS: 1 day   Time spent: 35 minutes.  Patrecia Pour, MD [facility] [email or web address] 08/13/2021, 5:34 PM

## 2021-08-13 NOTE — Progress Notes (Signed)
Consulted by primary RN to assess patient's port. Upon  assessment found that the hub of the tubing on the needle was complete torn off. Once dressing removed the needle was out of the port. Cleaned site and reaccessed port per policy.Staff RN to change IV tubing before using port again.

## 2021-08-14 DIAGNOSIS — R579 Shock, unspecified: Secondary | ICD-10-CM | POA: Diagnosis present

## 2021-08-14 DIAGNOSIS — U071 COVID-19: Secondary | ICD-10-CM | POA: Diagnosis not present

## 2021-08-14 LAB — CBC WITH DIFFERENTIAL/PLATELET
Abs Immature Granulocytes: 0.12 10*3/uL — ABNORMAL HIGH (ref 0.00–0.07)
Basophils Absolute: 0 10*3/uL (ref 0.0–0.1)
Basophils Relative: 0 %
Eosinophils Absolute: 0 10*3/uL (ref 0.0–0.5)
Eosinophils Relative: 0 %
HCT: 28.2 % — ABNORMAL LOW (ref 36.0–46.0)
Hemoglobin: 9.3 g/dL — ABNORMAL LOW (ref 12.0–15.0)
Immature Granulocytes: 1 %
Lymphocytes Relative: 2 %
Lymphs Abs: 0.2 10*3/uL — ABNORMAL LOW (ref 0.7–4.0)
MCH: 30.2 pg (ref 26.0–34.0)
MCHC: 33 g/dL (ref 30.0–36.0)
MCV: 91.6 fL (ref 80.0–100.0)
Monocytes Absolute: 0.6 10*3/uL (ref 0.1–1.0)
Monocytes Relative: 6 %
Neutro Abs: 9.1 10*3/uL — ABNORMAL HIGH (ref 1.7–7.7)
Neutrophils Relative %: 91 %
Platelets: 159 10*3/uL (ref 150–400)
RBC: 3.08 MIL/uL — ABNORMAL LOW (ref 3.87–5.11)
RDW: 13.5 % (ref 11.5–15.5)
WBC: 10.1 10*3/uL (ref 4.0–10.5)
nRBC: 0 % (ref 0.0–0.2)

## 2021-08-14 LAB — COMPREHENSIVE METABOLIC PANEL
ALT: 16 U/L (ref 0–44)
AST: 21 U/L (ref 15–41)
Albumin: 2.4 g/dL — ABNORMAL LOW (ref 3.5–5.0)
Alkaline Phosphatase: 236 U/L — ABNORMAL HIGH (ref 38–126)
Anion gap: 10 (ref 5–15)
BUN: 10 mg/dL (ref 6–20)
CO2: 29 mmol/L (ref 22–32)
Calcium: 8.3 mg/dL — ABNORMAL LOW (ref 8.9–10.3)
Chloride: 100 mmol/L (ref 98–111)
Creatinine, Ser: 0.55 mg/dL (ref 0.44–1.00)
GFR, Estimated: >60 mL/min (ref >60)
Glucose, Bld: 154 mg/dL — ABNORMAL HIGH (ref 70–99)
Potassium: 2.9 mmol/L — ABNORMAL LOW (ref 3.5–5.1)
Sodium: 139 mmol/L (ref 135–145)
Total Bilirubin: 1 mg/dL (ref 0.3–1.2)
Total Protein: 5 g/dL — ABNORMAL LOW (ref 6.5–8.1)

## 2021-08-14 LAB — GLUCOSE, CAPILLARY
Glucose-Capillary: 138 mg/dL — ABNORMAL HIGH (ref 70–99)
Glucose-Capillary: 122 mg/dL — ABNORMAL HIGH (ref 70–99)
Glucose-Capillary: 136 mg/dL — ABNORMAL HIGH (ref 70–99)

## 2021-08-14 LAB — LACTIC ACID, PLASMA: Lactic Acid, Venous: 1.2 mmol/L (ref 0.5–1.9)

## 2021-08-14 LAB — C-REACTIVE PROTEIN: CRP: 19.3 mg/dL — ABNORMAL HIGH (ref <1.0)

## 2021-08-14 LAB — PROCALCITONIN: Procalcitonin: 1.8 ng/mL

## 2021-08-14 LAB — MRSA NEXT GEN BY PCR, NASAL: MRSA by PCR Next Gen: NOT DETECTED

## 2021-08-14 MED ORDER — POTASSIUM CHLORIDE 10 MEQ/100ML IV SOLN
10.0000 meq | INTRAVENOUS | Status: AC
  Administered 2021-08-14 (×4): 10 meq via INTRAVENOUS
  Filled 2021-08-14 (×4): qty 100

## 2021-08-14 MED ORDER — SUMATRIPTAN SUCCINATE 6 MG/0.5ML ~~LOC~~ SOLN
6.0000 mg | SUBCUTANEOUS | Status: DC | PRN
Start: 2021-08-14 — End: 2021-08-17
  Administered 2021-08-14: 6 mg via SUBCUTANEOUS
  Filled 2021-08-14 (×2): qty 0.5

## 2021-08-14 MED ORDER — ACETAMINOPHEN 10 MG/ML IV SOLN
1000.0000 mg | Freq: Four times a day (QID) | INTRAVENOUS | Status: AC | PRN
  Administered 2021-08-14 – 2021-08-15 (×2): 1000 mg via INTRAVENOUS
  Filled 2021-08-14 (×3): qty 100

## 2021-08-14 MED ORDER — POTASSIUM CHLORIDE 2 MEQ/ML IV SOLN
INTRAVENOUS | Status: DC
  Filled 2021-08-14 (×2): qty 1000

## 2021-08-14 MED ORDER — LACTATED RINGERS IV BOLUS
1000.0000 mL | Freq: Once | INTRAVENOUS | Status: AC
  Administered 2021-08-14: 1000 mL via INTRAVENOUS

## 2021-08-14 MED ORDER — POTASSIUM CHLORIDE 2 MEQ/ML IV SOLN
INTRAVENOUS | Status: DC
  Filled 2021-08-14 (×7): qty 1000

## 2021-08-14 NOTE — Progress Notes (Signed)
Transferred to 1437 at 2045

## 2021-08-14 NOTE — Consult Note (Signed)
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 In the ED she was febrile (103.90F), with neutrophilic leukocytosis (WBC 12.7k) with lymphopenia (100), appeared dehydrated with severe hypokalemia, hypomagnesemia, and hyperglycemia without acidosis or ketonemia. SARS-CoV-2 PCR was positive. After abnormal CXR, CTA chest revealed no PE but showed hilar mass infiltrating mediastinum with left perihilar consolidation concerning for postobstructive pneumonia, presumably due to known lymphoma. Paxlovid was initially given, changed to remdesivir. Cefepime is started for bacterial pneumonia as well. Insulin, IV fluids and electrolyte supplementation have been ongoing as well. The patient continued to have high spiking fevers with associated intermittent encephalopathy. Vancomycin and flagyl were added and the patient was transferred to progressive care unit 9/23. 9/25 Now patient is having significant hypothermia and hypotension so transferred to step down and pccm asked to eval pm 9/24      Significant Hospital Events: Including procedures, antibiotic start and stop dates in addition to other pertinent events   Transfer to step down 9/24     Scheduled Meds:  albuterol  2 puff Inhalation Q6H   Chlorhexidine Gluconate Cloth  6 each Topical Daily   enoxaparin (LOVENOX) injection  40 mg Subcutaneous Q24H   feeding supplement  237 mL Oral BID BM   feeding supplement  237 mL Oral BID BM   insulin aspart  0-15 Units Subcutaneous TID WC    insulin aspart  0-5 Units Subcutaneous QHS   insulin glargine-yfgn  10 Units Subcutaneous Daily   linagliptin  5 mg Oral Daily   living well with diabetes book   Does not apply Once   multivitamin with minerals  1 tablet Oral Daily   pantoprazole  40 mg Oral Daily   Continuous Infusions:  acetaminophen     ceFEPime (MAXIPIME) IV 2 g (08/14/21 1331)   lactated ringers with kcl     metronidazole 500 mg (08/14/21 0739)   potassium chloride 10 mEq (08/14/21 1536)   remdesivir 100 mg in NS 100 mL 100 mg (08/14/21 1124)   vancomycin 750 mg (08/14/21 0856)   PRN Meds:.acetaminophen, calcium carbonate, guaiFENesin-dextromethorphan, LORazepam, ondansetron (ZOFRAN) IV, prochlorperazine, senna-docusate, sodium chloride flush, traMADol   Interim History / Subjective:  Looks better than her numbers, denies pain, sob, cough nausea  Objective   Blood pressure (!) 88/58, pulse 68, temperature (!) 93 F (33.9 C), temperature source Rectal, resp. rate 20, height 5\' 8"  (1.727 m), weight 62.1 kg, SpO2 98 %.        Intake/Output Summary (Last 24 hours) at 08/14/2021 1545 Last data filed at 08/14/2021 1200 Gross per 24 hour  Intake 1267.51 ml  Output --  Net 1267.51 ml   Filed Weights   08/11/21 1909  Weight: 62.1 kg    Examination: Tmax 103 at 0100 this am  previous to hypothermic plunge General: Pale wf does not appear toxic, sats 98% RA HENT: mucosa a bit dry  Lungs: minimal insp /exp rhonchi  Cardiovascular: RR  pulse 75 Abdomen: soft/ benign  Extremities: warms s edema Neuro: alert and approp/ no motor  def       CTa 9/22 1. Negative for acute pulmonary embolus.  2. Left hilar mass-like soft tissue encases and mildly narrows the left hilar airways and pulmonary artery branches, also appears to infiltrate into the mediastinum to the precarinal nodal station. Surrounding left perihilar consolidation with air bronchograms. No pleural effusion. This most resembles left hilar  tumor with postobstructive Pneumonia. Lymphoma and Bronchogenic Carcinoma are considerations in this setting.     Assessment & Plan:  Shock c/w Low grade sepsis ? Etiology in pt with ? Post obst changes in L lung in setting of lymphoma c/b covid19 infection -  PCT trending down and and she looks pretty good so probably have the source covered >>> keep "tank full" in absence of evidence of vol overload >>> check am cortisol scheduled for am 9/25 - she looks better than her numbers presently so will hold off on empirical rx with [facility] and just rx with abx/ fluids      2) Anemia of acute illness, mild but may be contributing to relatively low bp  Theshold for tx hgb < 8 if hypotensive or req pressors    Lab Results  Component Value Date   HGB 9.3 (L) 08/14/2021   HGB 9.5 (L) 08/13/2021   HGB 9.2 (L) 08/13/2021       Labs   CBC: Recent Labs  Lab 08/11/21 1506 08/11/21 1555 08/12/21 0415 08/13/21 0500 08/13/21 1957 08/14/21 0307  WBC 11.7*  --  12.7* 13.1* 10.4 10.1  NEUTROABS  --   --  12.2* 11.8*  --  9.1*  HGB 11.7* 11.2* 10.5* 9.2* 9.5* 9.3*  HCT 34.6* 33.0* 31.5* 27.8* 28.7* 28.2*  MCV 88.5  --  90.8 92.4 91.4 91.6  PLT 267  --  190 144* 148* 838    Basic Metabolic Panel: Recent Labs  Lab 08/11/21 1506 08/11/21 1555 08/11/21 1623 08/12/21 0415 08/12/21 1541 08/13/21 0500 08/14/21 0307  NA 128* 128*  --  135 136 134* 139  K 2.9* 2.8*  --  2.7* 4.1 3.7 2.9*  CL 85*  --   --  95* 98 94* 100  CO2 31  --   --  32 27 27 29   GLUCOSE 416*  --   --  271* 444* 163* 154*  BUN 10  --   --  7 11 13 10   CREATININE 0.61  --   --  0.44 0.48 0.49 0.55  CALCIUM 8.7*  --   --  8.3* 8.5* 8.2* 8.3*  MG  --   --  1.5* 1.9  --   --   --    GFR: Estimated Creatinine Clearance: 74.2 mL/min (by C-G formula based on SCr of 0.55 mg/dL). Recent Labs  Lab 08/12/21 0415 08/12/21 1541 08/13/21 0500 08/13/21 1957 08/13/21 2204 08/14/21 0307  PROCALCITON  --  0.67 2.92  --   --   1.80  WBC 12.7*  --  13.1* 10.4  --  10.1  LATICACIDVEN  --   --   --  1.1 0.9  --     Liver Function Tests: Recent Labs  Lab 08/11/21 1531 08/12/21 0415 08/13/21 0500 08/14/21 0307  AST 12* 17 19 21   ALT 11 14 14 16   ALKPHOS 306* 299* 245* 236*  BILITOT 1.2 1.4* 0.9 1.0  PROT 6.0* 5.7* 4.9* 5.0*  ALBUMIN 3.5 2.8* 2.3* 2.4*   No results for input(s): LIPASE, AMYLASE in the last 168 hours. Recent Labs  Lab  08/11/21 1534  AMMONIA 30    ABG    Component Value Date/Time   HCO3 35.6 (H) 08/11/2021 1555   TCO2 37 (H) 08/11/2021 1555   O2SAT 26.0 08/11/2021 1555     Coagulation Profile: No results for input(s): INR, PROTIME in the last 168 hours.  Cardiac Enzymes: No results for input(s): CKTOTAL, CKMB, CKMBINDEX, TROPONINI in the last 168 hours.  HbA1C: Hgb A1c MFr Bld  Date/Time Value Ref Range Status  08/13/2021 05:00 AM 9.4 (H) 4.8 - 5.6 % Final    Comment:    (NOTE) Pre diabetes:          5.7%-6.4%  Diabetes:              >6.4%  Glycemic control for   <7.0% adults with diabetes   08/11/2021 10:59 PM 9.8 (H) 4.8 - 5.6 % Final    Comment:    (NOTE)         Prediabetes: 5.7 - 6.4         Diabetes: >6.4         Glycemic control for adults with diabetes: <7.0     CBG: Recent Labs  Lab 08/13/21 1125 08/13/21 1730 08/13/21 2206 08/14/21 0735 08/14/21 1135  GLUCAP 162* 159* 135* 138* 122*      Past Medical History:  She,  has a past medical history of ADD (attention deficit disorder), Diabetes mellitus without complication ([place]), and Hypertension.   Surgical History:   Past Surgical History:  Procedure Laterality Date   ABDOMINAL HYSTERECTOMY     CESAREAN SECTION     CHOLECYSTECTOMY       Social History:   reports that she quit smoking about 2 years ago. Her smoking use included cigarettes. She has never used smokeless tobacco. She reports current alcohol use of about 2.0 standard drinks per week. She reports that she does not use drugs.    Family History:  Her family history includes Diabetes in her mother; Heart disease in her brother and father. There is no history of Ataxia.   Allergies Allergies  Allergen Reactions   Codeine    Iodinated Diagnostic Agents Rash   Lamisil [Terbinafine] Rash     Home Medications  Prior to Admission medications   Medication Sig Start Date End Date Taking? Authorizing Provider  amphetamine-dextroamphetamine (ADDERALL) 20 MG tablet Take 20 mg by mouth 2 (two) times daily.   Yes [provider]  DULoxetine (CYMBALTA) 60 MG capsule Take 60 mg by mouth daily. 07/05/21  Yes [provider]  gabapentin (NEURONTIN) 300 MG capsule Take 300 mg by mouth 3 (three) times daily as needed (pain). 04/30/21  Yes [provider]  ibuprofen (ADVIL) 200 MG tablet Take 400 mg by mouth every 6 (six) hours as needed for mild pain or headache.   Yes [provider]  KLOR-CON M20 20 MEQ tablet Take 20 mEq by mouth 2 (two) times daily. 07/27/21  Yes [provider]  LORazepam (ATIVAN) 0.5 MG tablet Take 0.5 mg by mouth every 6 (six) hours as needed for anxiety.   Yes [provider]  valACYclovir (VALTREX) 1000 MG tablet Take 1 g by mouth 3 (three) times daily as needed (flare up). 07/06/21  Yes [provider]  vitamin B-12 (CYANOCOBALAMIN) 500 MCG tablet Take 500 mcg by mouth daily.   Yes [provider]  Zanubrutinib 80 MG CAPS Take 160 mg by mouth in the morning and at bedtime. 04/08/21  Yes [provider]  The patient is critically ill with multiple organ systems failure and requires high complexity decision making for assessment and support, frequent evaluation and titration of therapies, application of advanced monitoring technologies and extensive interpretation of multiple databases. Critical Care Time devoted to patient care services described in this note is 45 minutes.   Christinia Gully, MD Pulmonary and [place] [phone]   After 7:00 pm call Elink  [phone]

## 2021-08-14 NOTE — Progress Notes (Signed)
PROGRESS NOTE  Sonya Daugherty  ENI:[id number] DOB: 08-24-1961 DOA: 08/11/2021 PCP: Chesley Noon, MD  Outpatient Specialists: Oncology, Dr. Marylou Mccoy Brief Narrative: Sonya Daugherty is a 60 y.o. 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 In the ED she was febrile (103.22F), with neutrophilic leukocytosis (WBC 12.7k) with lymphopenia (100), appeared dehydrated with severe hypokalemia, hypomagnesemia, and hyperglycemia without acidosis or ketonemia. SARS-CoV-2 PCR was positive. After abnormal CXR, CTA chest revealed no PE but showed hilar mass infiltrating mediastinum with left perihilar consolidation concerning for postobstructive pneumonia, presumably due to known lymphoma. Paxlovid was initially given, changed to remdesivir. Cefepime is started for bacterial pneumonia as well. Insulin, IV fluids and electrolyte supplementation have been ongoing as well. The patient continued to have high spiking fevers with associated intermittent encephalopathy. Vancomycin and flagyl were added and the patient was transferred to progressive care unit 9/23. Now patient is having significant hypothermia and hypotension.   Assessment & Plan: Principal Problem:   COVID-19 virus infection Active Problems:   Essential hypertension, benign   Hypokalemia   Hypomagnesemia   Lymphoma (HCC)   Exertional dyspnea   Diabetes mellitus type 2 in nonobese ([place])   COVID-19  Covid-19 pneumonia in immunocompromised patient: Not vaccinated, currently on oral chemotherapy for lymphoma. PCR +9/21. Pt reports previous positive test but wasn't recently and she can't recall when. In Care Everywhere, I see negative PCR tests 06/08/19, 09/26/19, 12/27/19, 02/13/20, 04/24/20 and 05/11/2020. +RUL GGO on  CTA consistent with covid infiltrate. Noted to be lymphopenic  - Continue remdesivir  - Received solucortef for hx contrast allergy, but not truly hypoxic and with concomitant bacterial infection, so not continuing steroids at this time.  - Isolation x10 days - IS, FV, OOB  Severe sepsis due to postobstructive pneumonia: Leukocytosis, high fevers, tachycardia, tachypnea and having intermittent acute metabolic encephalopathy as well attributable to this/fevers. - WBC trending downward and PCT responding as of 9/24. - Started cefepime 9/22, added vancomycin, flagyl 9/23. Sputum culture with GNRs (still pending speciation). MRSA PCR negative, could deescalate abx once clinically stabilizing. - Repeat LR bolus due to persistent hypotension. Lactic acid normal x2 last night. ?If would benefit from stress steroids. Will consult PCCM.  - Continue to treat fevers with tylenol. Currently hypothermic.  - Blood cultures remain NGTD. - No hypoxia.  Marginal zone lymphoma: Per notes from Dr. Marylou Mccoy, history is: Dx stage III, low grade MZL started on bendamustine, rituximab 06/22/20, completed Dec 2021, PET scan 03/02/21 showed disease progression for which zanubrutinib was started 04/12/2021. She is scheduled to have follow up CT scan prior to next visit with oncology Oct 2022.  - Hold chemotherapy for now with active infection  Hypokalemia:  - Add supplementation to IVF.  - Augment supplementation, give Mg, recheck this PM to inform further needs.   T2DM: Unclear precipitant, HbA1c 7.9% 02/18/2021, has been consistently elevated back to 8.2% 07/18/2018. Has not been on treatment PTA.  - Control improving now that hydrocortisone is metabolizing out. Continue basal (10u), bolus (moderate SSI) insulin.  - Continue new start linagliptin, check HbA1c (would risk hypoglycemia if overshooting steroid-induced hyperglycemia with lower A1c).   Vomiting:  - Continue IVF and IV tylenol prn  Anemia of chronic disease:  Hgb down without active bleeding.  - Recheck serially, has  stabilized  Thrombocytopenia: Acute, likely due to sepsis.  - Improving.  DVT prophylaxis: Lovenox 40mg  q24h Code Status: Full Family Communication: None at bedside Disposition Plan: Transfer to SDU  Status is: Inpatient  Remains inpatient appropriate because:Persistent severe electrolyte disturbances, Ongoing diagnostic testing needed not appropriate for outpatient work up, IV treatments appropriate due to intensity of illness or inability to take PO, and Inpatient level of care appropriate due to severity of illness  Dispo: The patient is from: Home              Anticipated d/c is to: Home              Patient currently is not medically stable to d/c.  Consultants:  PCCM  Procedures:  None  Antimicrobials: Paxlovid 9/21 Remdesivir 9/22 >> Cefepime 9/22 >>  Vancomycin, flagyl 9/23 >>  Subjective: Continues to have low blood pressures and now feels dizzy at rest. No chest pain, dyspnea, but feels tired. Temperature would not register orally today, rectal temp persistently down to 92. Not eating. States her toes are numb, found to be more paresthetic on exam.   Objective: Vitals:   08/14/21 0600 08/14/21 0742 08/14/21 1119 08/14/21 1254  BP:  (!) 83/38 (!) 82/40 (!) 91/57  Pulse:   61 (!) 59  Resp: 20 20    Temp:  (!) 92.8 F (33.8 C) (!) 93 F (33.9 C)   TempSrc:  Rectal Rectal   SpO2:  96%  100%  Weight:      Height:        Intake/Output Summary (Last 24 hours) at 08/14/2021 1257 Last data filed at 08/14/2021 0400 Gross per 24 hour  Intake 1147.51 ml  Output --  Net 1147.51 ml   Filed Weights   08/11/21 1909  Weight: 62.1 kg   Gen: 60 y.o. female in no distress Pulm: Nonlabored breathing room air. Clear. CV: Regular borderline bradycardia. No murmur, rub, or gallop. No JVD, no dependent edema. GI: Abdomen soft, non-tender, non-distended, with normoactive bowel sounds.  Ext: Warm, dry, no  deformities. Brisk cap refill in distal extremities with palpable DP and radial pulses.  Skin: No erythema. Toes appear normal.  Neuro: Drowsy but conversant. Oriented but slowed cognitively. No focal neurological deficits. Psych: Judgement and insight appear fair. Mood euthymic & affect congruent. Behavior is appropriate.     Data Reviewed: I have personally reviewed following labs and imaging studies  CBC: Recent Labs  Lab 08/11/21 1506 08/11/21 1555 08/12/21 0415 08/13/21 0500 08/13/21 1957 08/14/21 0307  WBC 11.7*  --  12.7* 13.1* 10.4 10.1  NEUTROABS  --   --  12.2* 11.8*  --  9.1*  HGB 11.7* 11.2* 10.5* 9.2* 9.5* 9.3*  HCT 34.6* 33.0* 31.5* 27.8* 28.7* 28.2*  MCV 88.5  --  90.8 92.4 91.4 91.6  PLT 267  --  190 144* 148* 425   Basic Metabolic Panel: Recent Labs  Lab 08/11/21 1506 08/11/21 1555 08/11/21 1623 08/12/21 0415 08/12/21 1541 08/13/21 0500 08/14/21 0307  NA 128* 128*  --  135 136 134* 139  K 2.9* 2.8*  --  2.7* 4.1 3.7 2.9*  CL 85*  --   --  95* 98 94* 100  CO2 31  --   --  32 27 27 29   GLUCOSE 416*  --   --  271* 444* 163* 154*  BUN 10  --   --  7 11 13 10   CREATININE 0.61  --   --  0.44 0.48 0.49 0.55  CALCIUM 8.7*  --   --  8.3* 8.5* 8.2* 8.3*  MG  --   --  1.5* 1.9  --   --   --    GFR: Estimated Creatinine Clearance: 74.2 mL/min (by C-G formula based on SCr of 0.55 mg/dL). Liver Function Tests: Recent Labs  Lab 08/11/21 1531 08/12/21 0415 08/13/21 0500 08/14/21 0307  AST 12* 17 19 21   ALT 11 14 14 16   ALKPHOS 306* 299* 245* 236*  BILITOT 1.2 1.4* 0.9 1.0  PROT 6.0* 5.7* 4.9* 5.0*  ALBUMIN 3.5 2.8* 2.3* 2.4*   No results for input(s): LIPASE, AMYLASE in the last 168 hours. Recent Labs  Lab 08/11/21 1534  AMMONIA 30   Coagulation Profile: No results for input(s): INR, PROTIME in the last 168 hours. Cardiac Enzymes: No results for input(s): CKTOTAL, CKMB, CKMBINDEX, TROPONINI in the last 168 hours. BNP (last 3 results) No results  for input(s): PROBNP in the last 8760 hours. HbA1C: Recent Labs    08/11/21 2259 08/13/21 0500  HGBA1C 9.8* 9.4*   CBG: Recent Labs  Lab 08/13/21 1125 08/13/21 1730 08/13/21 2206 08/14/21 0735 08/14/21 1135  GLUCAP 162* 159* 135* 138* 122*   Lipid Profile: No results for input(s): CHOL, HDL, LDLCALC, TRIG, CHOLHDL, LDLDIRECT in the last 72 hours. Thyroid Function Tests: Recent Labs    08/11/21 1531  TSH 0.553   Anemia Panel: No results for input(s): VITAMINB12, FOLATE, FERRITIN, TIBC, IRON, RETICCTPCT in the last 72 hours. Urine analysis:    Component Value Date/Time   COLORURINE YELLOW (A) 08/11/2021 1506   APPEARANCEUR CLEAR 08/11/2021 1506   LABSPEC <1.005 (L) 08/11/2021 1506   PHURINE 6.5 08/11/2021 1506   GLUCOSEU 100 (A) 08/11/2021 1506   HGBUR NEGATIVE 08/11/2021 1506   BILIRUBINUR NEGATIVE 08/11/2021 1506   KETONESUR NEGATIVE 08/11/2021 1506   PROTEINUR NEGATIVE 08/11/2021 1506   UROBILINOGEN 0.2 08/09/2007 1957   NITRITE NEGATIVE 08/11/2021 1506   LEUKOCYTESUR TRACE (A) 08/11/2021 1506   Recent Results (from the past 240 hour(s))  Resp Panel by RT-PCR (Flu A&B, Covid) Nasopharyngeal Swab     Status: Abnormal   Collection Time: 08/11/21  3:07 PM   Specimen: Nasopharyngeal Swab; Nasopharyngeal(NP) swabs in vial transport medium  Result Value Ref Range Status   SARS Coronavirus 2 by RT PCR POSITIVE (A) NEGATIVE Final    Comment: RESULT CALLED TO, READ BACK BY AND VERIFIED WITH: T CARBONE,RN 1651 08/11/21 DBRADLEY (NOTE) SARS-CoV-2 target nucleic acids are DETECTED.  The SARS-CoV-2 RNA is generally detectable in upper respiratory specimens during the acute phase of infection. Positive results are indicative of the presence of the identified virus, but do not rule out bacterial infection or co-infection with other pathogens not detected by the test. Clinical correlation with patient history and other diagnostic information is necessary to determine  patient infection status. The expected result is Negative.  Fact Sheet for Patients: EntrepreneurPulse.com.au  Fact Sheet for Healthcare Providers: IncredibleEmployment.be  This test is not yet approved or cleared by the [place] FDA and  has been authorized for detection and/or diagnosis of SARS-CoV-2 by FDA under an Emergency Use Authorization (EUA).  This EUA will remain in effect (meaning this test can b e used) for the duration of  the COVID-19 declaration under Section 564(b)(1) of the Act, 21 U.S.C. section 360bbb-3(b)(1), unless the authorization is terminated or revoked sooner.     Influenza A by PCR NEGATIVE NEGATIVE Final   Influenza B by  PCR NEGATIVE NEGATIVE Final    Comment: (NOTE) The Xpert Xpress SARS-CoV-2/FLU/RSV plus assay is intended as an aid in the diagnosis of influenza from Nasopharyngeal swab specimens and should not be used as a sole basis for treatment. Nasal washings and aspirates are unacceptable for Xpert Xpress SARS-CoV-2/FLU/RSV testing.  Fact Sheet for Patients: EntrepreneurPulse.com.au  Fact Sheet for Healthcare Providers: IncredibleEmployment.be  This test is not yet approved or cleared by the [place] FDA and has been authorized for detection and/or diagnosis of SARS-CoV-2 by FDA under an Emergency Use Authorization (EUA). This EUA will remain in effect (meaning this test can be used) for the duration of the COVID-19 declaration under Section 564(b)(1) of the Act, 21 U.S.C. section 360bbb-3(b)(1), unless the authorization is terminated or revoked.  Performed at [facility], [address], [place], [place] [address]   Culture, blood (routine x 2)     Status: None (Preliminary result)   Collection Time: 08/12/21  4:38 PM   Specimen: BLOOD  Result Value Ref Range Status   Specimen Description   Final    BLOOD BLOOD LEFT  FOREARM Performed at [facility] [address], [place], [place] [address]    Special Requests   Final    BOTTLES DRAWN AEROBIC ONLY Blood Culture adequate volume Performed at [facility] [address], [place], [place] [address]    Culture   Final    NO GROWTH 2 DAYS Performed at [facility] [address] [place], [place] [address]    Report Status PENDING  Incomplete  Culture, blood (routine x 2)     Status: None (Preliminary result)   Collection Time: 08/12/21  4:38 PM   Specimen: BLOOD  Result Value Ref Range Status   Specimen Description   Final    BLOOD LEFT ANTECUBITAL Performed at [facility] [address] [place], [place] [address]    Special Requests   Final    BOTTLES DRAWN AEROBIC ONLY Blood Culture adequate volume Performed at [facility] [address], [place], [place] [address]    Culture   Final    NO GROWTH 2 DAYS Performed at [facility] [address], [place], [place] [address]    Report Status PENDING  Incomplete  MRSA Next Gen by PCR, Nasal     Status: None   Collection Time: 08/12/21  6:19 PM   Specimen: Nasal Mucosa; Nasal Swab  Result Value Ref Range Status   MRSA by PCR Next Gen NOT DETECTED NOT DETECTED Final    Comment: (NOTE) The GeneXpert MRSA Assay (FDA approved for NASAL specimens only), is one component of a comprehensive MRSA colonization surveillance program. It is not intended to diagnose MRSA infection nor to guide or monitor treatment for MRSA infections. Test performance is not FDA approved in patients less than 34 years old. Performed at [facility], [place] [address] [place], [place] [address]   Expectorated Sputum Assessment w Gram Stain, Rflx to Resp Cult     Status: None   Collection Time: 08/13/21  4:00 AM   Specimen: Expectorated Sputum  Result Value Ref Range Status   Specimen Description EXPECTORATED  SPUTUM  Final   Special Requests NONE  Final   Sputum evaluation   Final    THIS SPECIMEN IS ACCEPTABLE FOR SPUTUM CULTURE Performed at [facility], [place] [address] [place], [place] [address]    Report Status  08/13/2021 FINAL  Final  Culture, Respiratory w Gram Stain     Status: None (Preliminary result)   Collection Time: 08/13/21  4:00 AM  Result Value Ref Range Status   Specimen Description   Final    EXPECTORATED SPUTUM Performed at [facility], [place] [address], [place], [place] [address]    Special Requests   Final    NONE Reflexed from [id number] Performed at [facility], [place] [address], [place], [place] [address]    Gram Stain   Final    FEW SQUAMOUS EPITHELIAL CELLS PRESENT FEW WBC SEEN FEW GRAM NEGATIVE RODS    Culture   Final    CULTURE REINCUBATED FOR BETTER GROWTH Performed at [facility], [place] [address] [place], [place] [address]    Report Status PENDING  Incomplete      Radiology Studies: No results found.  Scheduled Meds:  albuterol  2 puff Inhalation Q6H   Chlorhexidine Gluconate Cloth  6 each Topical Daily   enoxaparin (LOVENOX) injection  40 mg Subcutaneous Q24H   feeding supplement  237 mL Oral BID BM   feeding supplement  237 mL Oral BID BM   insulin aspart  0-15 Units Subcutaneous TID WC   insulin aspart  0-5 Units Subcutaneous QHS   insulin glargine-yfgn  10 Units Subcutaneous Daily   linagliptin  5 mg Oral Daily   living well with diabetes book   Does not apply Once   multivitamin with minerals  1 tablet Oral Daily   pantoprazole  40 mg Oral Daily   Continuous Infusions:  acetaminophen Stopped (08/14/21 1146)   ceFEPime (MAXIPIME) IV 2 g (08/14/21 0550)   lactated ringers with kcl 100 mL/hr at 08/14/21 0856   metronidazole 500 mg (08/14/21 0739)   remdesivir 100 mg in NS 100 mL 100 mg (08/14/21 1124)   vancomycin 750 mg (08/14/21 0856)     LOS: 2 days   Time spent: 35  minutes.  Patrecia Pour, MD [facility] [email or web address] 08/14/2021, 12:57 PM

## 2021-08-15 LAB — CULTURE, RESPIRATORY W GRAM STAIN

## 2021-08-15 LAB — COMPREHENSIVE METABOLIC PANEL
ALT: 14 U/L (ref 0–44)
AST: 17 U/L (ref 15–41)
Albumin: 2.3 g/dL — ABNORMAL LOW (ref 3.5–5.0)
Alkaline Phosphatase: 204 U/L — ABNORMAL HIGH (ref 38–126)
Anion gap: 10 (ref 5–15)
BUN: 12 mg/dL (ref 6–20)
CO2: 26 mmol/L (ref 22–32)
Calcium: 8 mg/dL — ABNORMAL LOW (ref 8.9–10.3)
Chloride: 104 mmol/L (ref 98–111)
Creatinine, Ser: 0.56 mg/dL (ref 0.44–1.00)
GFR, Estimated: >60 mL/min (ref >60)
Glucose, Bld: 97 mg/dL (ref 70–99)
Potassium: 3.3 mmol/L — ABNORMAL LOW (ref 3.5–5.1)
Sodium: 140 mmol/L (ref 135–145)
Total Bilirubin: 0.6 mg/dL (ref 0.3–1.2)
Total Protein: 4.4 g/dL — ABNORMAL LOW (ref 6.5–8.1)

## 2021-08-15 LAB — CBC WITH DIFFERENTIAL/PLATELET
Abs Immature Granulocytes: 0.05 10*3/uL (ref 0.00–0.07)
Basophils Absolute: 0 10*3/uL (ref 0.0–0.1)
Basophils Relative: 0 %
Eosinophils Absolute: 0 10*3/uL (ref 0.0–0.5)
Eosinophils Relative: 0 %
HCT: 27.3 % — ABNORMAL LOW (ref 36.0–46.0)
Hemoglobin: 9 g/dL — ABNORMAL LOW (ref 12.0–15.0)
Immature Granulocytes: 1 %
Lymphocytes Relative: 2 %
Lymphs Abs: 0.2 10*3/uL — ABNORMAL LOW (ref 0.7–4.0)
MCH: 30.2 pg (ref 26.0–34.0)
MCHC: 33 g/dL (ref 30.0–36.0)
MCV: 91.6 fL (ref 80.0–100.0)
Monocytes Absolute: 0.4 10*3/uL (ref 0.1–1.0)
Monocytes Relative: 6 %
Neutro Abs: 6.5 10*3/uL (ref 1.7–7.7)
Neutrophils Relative %: 91 %
Platelets: 147 10*3/uL — ABNORMAL LOW (ref 150–400)
RBC: 2.98 MIL/uL — ABNORMAL LOW (ref 3.87–5.11)
RDW: 13.6 % (ref 11.5–15.5)
WBC: 7.2 10*3/uL (ref 4.0–10.5)
nRBC: 0 % (ref 0.0–0.2)

## 2021-08-15 LAB — GLUCOSE, CAPILLARY
Glucose-Capillary: 83 mg/dL (ref 70–99)
Glucose-Capillary: 127 mg/dL — ABNORMAL HIGH (ref 70–99)
Glucose-Capillary: 220 mg/dL — ABNORMAL HIGH (ref 70–99)
Glucose-Capillary: 108 mg/dL — ABNORMAL HIGH (ref 70–99)

## 2021-08-15 LAB — MAGNESIUM: Magnesium: 1.4 mg/dL — ABNORMAL LOW (ref 1.7–2.4)

## 2021-08-15 LAB — CORTISOL-AM, BLOOD: Cortisol - AM: 13.9 ug/dL (ref 6.7–22.6)

## 2021-08-15 LAB — C-REACTIVE PROTEIN: CRP: 12.6 mg/dL — ABNORMAL HIGH (ref <1.0)

## 2021-08-15 LAB — PROCALCITONIN: Procalcitonin: 1.82 ng/mL

## 2021-08-15 MED ORDER — ACETAMINOPHEN 325 MG PO TABS
650.0000 mg | ORAL_TABLET | Freq: Four times a day (QID) | ORAL | Status: AC | PRN
  Administered 2021-08-16 (×3): 650 mg via ORAL
  Filled 2021-08-15 (×3): qty 2

## 2021-08-15 MED ORDER — SODIUM CHLORIDE 0.9 % IV SOLN
INTRAVENOUS | Status: DC | PRN
  Administered 2021-08-15: 250 mL via INTRAVENOUS

## 2021-08-15 MED ORDER — ACETAMINOPHEN 10 MG/ML IV SOLN
1000.0000 mg | Freq: Once | INTRAVENOUS | Status: AC
  Administered 2021-08-15: 1000 mg via INTRAVENOUS

## 2021-08-15 MED ORDER — POTASSIUM CHLORIDE CRYS ER 20 MEQ PO TBCR
40.0000 meq | EXTENDED_RELEASE_TABLET | ORAL | Status: AC
Start: 2021-08-15 — End: 2021-08-15
  Administered 2021-08-15 (×2): 40 meq via ORAL
  Filled 2021-08-15 (×2): qty 2

## 2021-08-15 NOTE — Progress Notes (Signed)
PROGRESS NOTE  Sonya Daugherty  STM:[id number] DOB: 1961/04/22 DOA: 08/11/2021 PCP: Chesley Noon, MD  Outpatient Specialists: Oncology, Dr. Marylou Mccoy  Brief Narrative: Sonya Daugherty is a 60 y.o. 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  In the ED SARS-CoV-2 PCR was found to be positive. After abnormal CXR, CTA chest revealed no PE but showed hilar mass infiltrating mediastinum with left perihilar consolidation concerning for postobstructive pneumonia, presumably due to known lymphoma. Paxlovid was initially given, and subsequently changed to remdesivir. Cefepime initiated for presumed bacterial pneumonia broadened to vancomycin and Flexeril given ongoing profound fevers, worsening respiratory status, and hypotension.  Assessment & Plan:  Covid-19 pneumonia in immunocompromised patient:  -Unvaccinated currently immunocompromised on oral chemotherapy  -PCR + 08/11/2021  -previously positive in the outpatient setting (?Home test) but patient is not clear on dates -all previous COVID testing in the system is negative but well over 6 months old - Continue remdesivir x5 days -Not hypoxic, hold steroids.  - Isolation x10 days - IS, FV, OOB  Severe sepsis due to postobstructive pneumonia:  -Hypotension improving with IV fluids and broad antibiotics  -White count now within normal limits -Ongoing fevers overnight despite broadening antibiotics worrisome for ongoing disease  -Sputum culture remarkable for Pseudomonas aeruginosa -pending sensitivities we will narrow antibiotics -Blood cultures remain negative  Marginal zone lymphoma:  - Stage III, low grade MZL started on bendamustine, rituximab 06/22/20, completed Dec 2021, PET scan 03/02/21 showed disease progression for which zanubrutinib  was started 04/12/2021.  - Scheduled to have follow up CT scan prior to next visit with oncology Oct 2022.  - Hold chemotherapy for now given active ongoing infection meeting severe sepsis criteria  Hypokalemia:  -Follow daily labs, increase p.o. intake as appropriate  T2DM:  -Uncontrolled HbA1c 9.4; previously 7.9% 02/18/2021, has been consistently elevated back to 8.2% 07/18/2018. Has not been on treatment PTA.  -Off steroids, continue sliding scale and glargine 10 units daily - Continue new start linagliptin  Chronic anemia of chronic disease:  -Likely concurrently hemodilution all given IV fluids as above with hypotension   Thrombocytopenia:  - Acute, likely due to sepsis.  - Improving.  DVT prophylaxis: Lovenox 40mg  q24h Code Status: Full Family Communication: None available Disposition Plan: Status is: Inpatient  Remains inpatient appropriate because:Persistent severe electrolyte disturbances, Ongoing diagnostic testing needed not appropriate for outpatient work up, IV treatments appropriate due to intensity of illness or inability to take PO, and Inpatient level of care appropriate due to severity of illness  Dispo: The patient is from: Home              Anticipated d/c is to: Home              Patient currently is not medically stable to d/c.  Consultants:  PCCM  Procedures:  None  Antimicrobials: Paxlovid 9/21 Remdesivir 9/22 >> 08/16/2021 Cefepime 9/22 >> 08/16/2021 Vancomycin, flagyl 9/23 >> 08/17/2021  Subjective: No acute issues or events overnight denies nausea vomiting diarrhea constipation headache or chest pain.  Ongoing chills and fevers overnight.  Objective: Vitals:   08/15/21 0400 08/15/21 0500 08/15/21 0600 08/15/21 0700  BP: (!) 105/58 132/65 130/69 (!) 116/47  Pulse: 86 70 72 71  Resp: 15 (!) 29 14 (!) 29  Temp:      TempSrc:  SpO2: 94% 97% 98% 95%  Weight:      Height:        Intake/Output Summary (Last 24 hours) at 08/15/2021  0733 Last data filed at 08/15/2021 0400 Gross per 24 hour  Intake 3977.72 ml  Output --  Net 3977.72 ml    Filed Weights   08/11/21 1909  Weight: 62.1 kg   Gen: 60 y.o. female in no distress Pulm: Nonlabored breathing room air. Clear. CV: Regular borderline bradycardia. No murmur, rub, or gallop. No JVD, no dependent edema. GI: Abdomen soft, non-tender, non-distended, with normoactive bowel sounds.  Ext: Warm, dry, no deformities. Brisk cap refill in distal extremities with palpable DP and radial pulses.  Skin: No erythema. Toes appear normal.  Neuro: Drowsy but conversant. Oriented but slowed cognitively. No focal neurological deficits. Psych: Judgement and insight appear fair. Mood euthymic & affect congruent. Behavior is appropriate.     Data Reviewed: I have personally reviewed following labs and imaging studies  CBC: Recent Labs  Lab 08/12/21 0415 08/13/21 0500 08/13/21 1957 08/14/21 0307 08/15/21 0558  WBC 12.7* 13.1* 10.4 10.1 7.2  NEUTROABS 12.2* 11.8*  --  9.1* 6.5  HGB 10.5* 9.2* 9.5* 9.3* 9.0*  HCT 31.5* 27.8* 28.7* 28.2* 27.3*  MCV 90.8 92.4 91.4 91.6 91.6  PLT 190 144* 148* 159 147*    Basic Metabolic Panel: Recent Labs  Lab 08/11/21 1623 08/12/21 0415 08/12/21 1541 08/13/21 0500 08/14/21 0307 08/15/21 0558  NA  --  135 136 134* 139 140  K  --  2.7* 4.1 3.7 2.9* 3.3*  CL  --  95* 98 94* 100 104  CO2  --  32 27 27 29 26   GLUCOSE  --  271* 444* 163* 154* 97  BUN  --  7 11 13 10 12   CREATININE  --  0.44 0.48 0.49 0.55 0.56  CALCIUM  --  8.3* 8.5* 8.2* 8.3* 8.0*  MG 1.5* 1.9  --   --   --   --     GFR: Estimated Creatinine Clearance: 74.2 mL/min (by C-G formula based on SCr of 0.56 mg/dL). Liver Function Tests: Recent Labs  Lab 08/11/21 1531 08/12/21 0415 08/13/21 0500 08/14/21 0307 08/15/21 0558  AST 12* 17 19 21 17   ALT 11 14 14 16 14   ALKPHOS 306* 299* 245* 236* 204*  BILITOT 1.2 1.4* 0.9 1.0 0.6  PROT 6.0* 5.7* 4.9* 5.0* 4.4*   ALBUMIN 3.5 2.8* 2.3* 2.4* 2.3*    No results for input(s): LIPASE, AMYLASE in the last 168 hours. Recent Labs  Lab 08/11/21 1534  AMMONIA 30    Coagulation Profile: No results for input(s): INR, PROTIME in the last 168 hours. Cardiac Enzymes: No results for input(s): CKTOTAL, CKMB, CKMBINDEX, TROPONINI in the last 168 hours. BNP (last 3 results) No results for input(s): PROBNP in the last 8760 hours. HbA1C: Recent Labs    08/13/21 0500  HGBA1C 9.4*    CBG: Recent Labs  Lab 08/13/21 1730 08/13/21 2206 08/14/21 0735 08/14/21 1135 08/14/21 2116  GLUCAP 159* 135* 138* 122* 136*    Lipid Profile: No results for input(s): CHOL, HDL, LDLCALC, TRIG, CHOLHDL, LDLDIRECT in the last 72 hours. Thyroid Function Tests: No results for input(s): TSH, T4TOTAL, FREET4, T3FREE, THYROIDAB in the last 72 hours.  Anemia Panel: No results for input(s): VITAMINB12, FOLATE, FERRITIN, TIBC, IRON, RETICCTPCT in the last 72 hours. Urine analysis:    Component Value Date/Time   COLORURINE YELLOW (A) 08/11/2021 1506  APPEARANCEUR CLEAR 08/11/2021 1506   LABSPEC <1.005 (L) 08/11/2021 1506   PHURINE 6.5 08/11/2021 1506   GLUCOSEU 100 (A) 08/11/2021 1506   HGBUR NEGATIVE 08/11/2021 1506   BILIRUBINUR NEGATIVE 08/11/2021 1506   KETONESUR NEGATIVE 08/11/2021 1506   PROTEINUR NEGATIVE 08/11/2021 1506   UROBILINOGEN 0.2 08/09/2007 1957   NITRITE NEGATIVE 08/11/2021 1506   LEUKOCYTESUR TRACE (A) 08/11/2021 1506   Recent Results (from the past 240 hour(s))  Resp Panel by RT-PCR (Flu A&B, Covid) Nasopharyngeal Swab     Status: Abnormal   Collection Time: 08/11/21  3:07 PM   Specimen: Nasopharyngeal Swab; Nasopharyngeal(NP) swabs in vial transport medium  Result Value Ref Range Status   SARS Coronavirus 2 by RT PCR POSITIVE (A) NEGATIVE Final    Comment: RESULT CALLED TO, READ BACK BY AND VERIFIED WITH: T CARBONE,RN 1651 08/11/21 DBRADLEY (NOTE) SARS-CoV-2 target nucleic acids are  DETECTED.  The SARS-CoV-2 RNA is generally detectable in upper respiratory specimens during the acute phase of infection. Positive results are indicative of the presence of the identified virus, but do not rule out bacterial infection or co-infection with other pathogens not detected by the test. Clinical correlation with patient history and other diagnostic information is necessary to determine patient infection status. The expected result is Negative.  Fact Sheet for Patients: EntrepreneurPulse.com.au  Fact Sheet for Healthcare Providers: IncredibleEmployment.be  This test is not yet approved or cleared by the [place] FDA and  has been authorized for detection and/or diagnosis of SARS-CoV-2 by FDA under an Emergency Use Authorization (EUA).  This EUA will remain in effect (meaning this test can b e used) for the duration of  the COVID-19 declaration under Section 564(b)(1) of the Act, 21 U.S.C. section 360bbb-3(b)(1), unless the authorization is terminated or revoked sooner.     Influenza A by PCR NEGATIVE NEGATIVE Final   Influenza B by PCR NEGATIVE NEGATIVE Final    Comment: (NOTE) The Xpert Xpress SARS-CoV-2/FLU/RSV plus assay is intended as an aid in the diagnosis of influenza from Nasopharyngeal swab specimens and should not be used as a sole basis for treatment. Nasal washings and aspirates are unacceptable for Xpert Xpress SARS-CoV-2/FLU/RSV testing.  Fact Sheet for Patients: EntrepreneurPulse.com.au  Fact Sheet for Healthcare Providers: IncredibleEmployment.be  This test is not yet approved or cleared by the [place] FDA and has been authorized for detection and/or diagnosis of SARS-CoV-2 by FDA under an Emergency Use Authorization (EUA). This EUA will remain in effect (meaning this test can be used) for the duration of the COVID-19 declaration under Section 564(b)(1) of the Act, 21  U.S.C. section 360bbb-3(b)(1), unless the authorization is terminated or revoked.  Performed at [facility], [address] [place], [place] [address]   Culture, blood (routine x 2)     Status: None (Preliminary result)   Collection Time: 08/12/21  4:38 PM   Specimen: BLOOD  Result Value Ref Range Status   Specimen Description   Final    BLOOD BLOOD LEFT FOREARM Performed at [facility] [address], [place], [place] [address]    Special Requests   Final    BOTTLES DRAWN AEROBIC ONLY Blood Culture adequate volume Performed at [facility] [address] [place], [place] [address]    Culture   Final    NO GROWTH 2 DAYS Performed at [facility] [address], [place],  [address]    Report Status PENDING  Incomplete  Culture, blood (routine  x 2)     Status: None (Preliminary result)   Collection Time: 08/12/21  4:38 PM   Specimen: BLOOD  Result Value Ref Range Status   Specimen Description   Final    BLOOD LEFT ANTECUBITAL Performed at [facility] [address], [place], [place] [address]    Special Requests   Final    BOTTLES DRAWN AEROBIC ONLY Blood Culture adequate volume Performed at [facility] [address], [place], [place] [address]    Culture   Final    NO GROWTH 2 DAYS Performed at [facility] [address], [place], [place] [address]    Report Status PENDING  Incomplete  MRSA Next Gen by PCR, Nasal     Status: None   Collection Time: 08/12/21  6:19 PM   Specimen: Nasal Mucosa; Nasal Swab  Result Value Ref Range Status   MRSA by PCR Next Gen NOT DETECTED NOT DETECTED Final    Comment: (NOTE) The GeneXpert MRSA Assay (FDA approved for NASAL specimens only), is one component of a comprehensive MRSA colonization surveillance program. It is not intended to diagnose MRSA infection nor to guide or monitor treatment for MRSA  infections. Test performance is not FDA approved in patients less than 44 years old. Performed at [facility], [place] [address], [place], [place] [address]   Expectorated Sputum Assessment w Gram Stain, Rflx to Resp Cult     Status: None   Collection Time: 08/13/21  4:00 AM   Specimen: Expectorated Sputum  Result Value Ref Range Status   Specimen Description EXPECTORATED SPUTUM  Final   Special Requests NONE  Final   Sputum evaluation   Final    THIS SPECIMEN IS ACCEPTABLE FOR SPUTUM CULTURE Performed at [facility], [place] [address], [place], [place] [address]    Report Status 08/13/2021 FINAL  Final  Culture, Respiratory w Gram Stain     Status: None (Preliminary result)   Collection Time: 08/13/21  4:00 AM  Result Value Ref Range Status   Specimen Description   Final    EXPECTORATED SPUTUM Performed at [facility] [address] [place], [place] [address]    Special Requests   Final    NONE Reflexed from [id number] Performed at [facility], [place] [address] [place], [place] [address]    Gram Stain   Final    FEW SQUAMOUS EPITHELIAL CELLS PRESENT FEW WBC SEEN FEW GRAM NEGATIVE RODS    Culture   Final    ABUNDANT PSEUDOMONAS AERUGINOSA SUSCEPTIBILITIES TO FOLLOW Performed at [facility], [place] [address], [place], [place] [address]    Report Status PENDING  Incomplete  MRSA Next Gen by PCR, Nasal     Status: None   Collection Time: 08/14/21  4:27 PM   Specimen: Nasal Mucosa; Nasal Swab  Result Value Ref Range Status   MRSA by PCR Next Gen NOT DETECTED NOT DETECTED Final    Comment: (NOTE) The GeneXpert MRSA Assay (FDA approved for NASAL specimens only), is one component of a comprehensive MRSA colonization surveillance program. It is not intended to diagnose MRSA infection nor to guide or monitor treatment for MRSA infections. Test performance is not FDA approved in patients less than 29  years old. Performed at [facility], [place] [address], [place], [place] [address]       Radiology Studies: No results found.  Scheduled Meds:  albuterol  2 puff Inhalation Q6H   Chlorhexidine Gluconate Cloth  6 each Topical Daily   enoxaparin (LOVENOX) injection  40 mg Subcutaneous Q24H   feeding supplement  237 mL Oral BID BM   feeding supplement  237 mL Oral BID BM   insulin aspart  0-15 Units Subcutaneous TID WC   insulin aspart  0-5 Units Subcutaneous QHS   insulin glargine-yfgn  10 Units Subcutaneous Daily   linagliptin  5 mg Oral Daily   living well with diabetes book   Does not apply Once   multivitamin with minerals  1 tablet Oral Daily   pantoprazole  40 mg Oral Daily   Continuous Infusions:  acetaminophen Stopped (08/15/21 0252)   ceFEPime (MAXIPIME) IV Stopped (08/15/21 5927)   lactated ringers with kcl 100 mL/hr at 08/15/21 0600   metronidazole Stopped (08/14/21 2032)   remdesivir 100 mg in NS 100 mL Stopped (08/14/21 1154)   vancomycin Stopped (08/15/21 0643)     LOS: 3 days   Time spent: 35 minutes.  Little Ishikawa, DO [facility] [email or web address] 08/15/2021, 7:33 AM

## 2021-08-16 LAB — GLUCOSE, CAPILLARY
Glucose-Capillary: 189 mg/dL — ABNORMAL HIGH (ref 70–99)
Glucose-Capillary: 87 mg/dL (ref 70–99)
Glucose-Capillary: 143 mg/dL — ABNORMAL HIGH (ref 70–99)
Glucose-Capillary: 132 mg/dL — ABNORMAL HIGH (ref 70–99)
Glucose-Capillary: 150 mg/dL — ABNORMAL HIGH (ref 70–99)

## 2021-08-16 LAB — COMPREHENSIVE METABOLIC PANEL
ALT: 14 U/L (ref 0–44)
AST: 23 U/L (ref 15–41)
Albumin: 2.4 g/dL — ABNORMAL LOW (ref 3.5–5.0)
Alkaline Phosphatase: 248 U/L — ABNORMAL HIGH (ref 38–126)
Anion gap: 6 (ref 5–15)
BUN: 10 mg/dL (ref 6–20)
CO2: 28 mmol/L (ref 22–32)
Calcium: 8.2 mg/dL — ABNORMAL LOW (ref 8.9–10.3)
Chloride: 104 mmol/L (ref 98–111)
Creatinine, Ser: 0.57 mg/dL (ref 0.44–1.00)
GFR, Estimated: >60 mL/min (ref >60)
Glucose, Bld: 98 mg/dL (ref 70–99)
Potassium: 4.1 mmol/L (ref 3.5–5.1)
Sodium: 138 mmol/L (ref 135–145)
Total Bilirubin: 0.7 mg/dL (ref 0.3–1.2)
Total Protein: 4.7 g/dL — ABNORMAL LOW (ref 6.5–8.1)

## 2021-08-16 LAB — CBC WITH DIFFERENTIAL/PLATELET
Abs Immature Granulocytes: 0.04 10*3/uL (ref 0.00–0.07)
Basophils Absolute: 0 10*3/uL (ref 0.0–0.1)
Basophils Relative: 0 %
Eosinophils Absolute: 0.1 10*3/uL (ref 0.0–0.5)
Eosinophils Relative: 2 %
HCT: 28 % — ABNORMAL LOW (ref 36.0–46.0)
Hemoglobin: 9.3 g/dL — ABNORMAL LOW (ref 12.0–15.0)
Immature Granulocytes: 1 %
Lymphocytes Relative: 4 %
Lymphs Abs: 0.2 10*3/uL — ABNORMAL LOW (ref 0.7–4.0)
MCH: 30.7 pg (ref 26.0–34.0)
MCHC: 33.2 g/dL (ref 30.0–36.0)
MCV: 92.4 fL (ref 80.0–100.0)
Monocytes Absolute: 0.4 10*3/uL (ref 0.1–1.0)
Monocytes Relative: 8 %
Neutro Abs: 4.2 10*3/uL (ref 1.7–7.7)
Neutrophils Relative %: 85 %
Platelets: 183 10*3/uL (ref 150–400)
RBC: 3.03 MIL/uL — ABNORMAL LOW (ref 3.87–5.11)
RDW: 13.8 % (ref 11.5–15.5)
WBC: 5 10*3/uL (ref 4.0–10.5)
nRBC: 0 % (ref 0.0–0.2)

## 2021-08-16 LAB — C-REACTIVE PROTEIN: CRP: 8.6 mg/dL — ABNORMAL HIGH (ref <1.0)

## 2021-08-16 NOTE — Progress Notes (Signed)
PHARMACY NOTE -  Cefepime  Pharmacy has been assisting with dosing of cefepime for pseudomonal pneumonia. Pathogen is sensitive to cefepime Dosage remains stable at 2g IV q8 hr and further renal adjustments per institutional Pharmacy antibiotic protocol  Pharmacy will sign off, following peripherally for culture results or dose adjustments. Please reconsult if a change in clinical status warrants re-evaluation of dosage.  Reuel Boom, PharmD, BCPS [phone] 08/16/2021, 2:24 PM

## 2021-08-16 NOTE — Progress Notes (Signed)
PROGRESS NOTE  Sonya Daugherty  HQP:[id number] DOB: [birth year] DOA: 08/11/2021 PCP: Chesley Noon, MD  Outpatient Specialists: Oncology, Dr. Marylou Mccoy  Brief Narrative: Sonya Daugherty is a 60 y.o. 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  In the ED SARS-CoV-2 PCR was found to be positive. After abnormal CXR, CTA chest revealed no PE but showed hilar mass infiltrating mediastinum with left perihilar consolidation concerning for postobstructive pneumonia, presumably due to known lymphoma. Paxlovid was initially given, and subsequently changed to remdesivir. Cefepime initiated for presumed bacterial pneumonia broadened to vancomycin and Flexeril given ongoing profound fevers, worsening respiratory status, and hypotension.  Assessment & Plan:  Covid-19 pneumonia in immunocompromised patient:  -Unvaccinated currently immunocompromised on oral chemotherapy  -PCR + 08/11/2021  -previously positive in the outpatient setting (?Home test) but patient is not clear on dates -all previous COVID testing in the system is negative but well over 6 months old - Completed 5 days of remdesivir - Not hypoxic, no indication for steroids - Isolation x10 days per current guidelines - IS, FV, OOB, proning as tolerated  Severe sepsis due to postobstructive pneumonia:  -Hypotension resolved with IV fluids and broadened antibiotics  -White count now within normal limits -Fevers ongoing but downtrending appropriately, mild fever over the past 24 hours -Sputum culture remarkable for Pseudomonas aeruginosa -appears to be broadly sensitive, will continue cefepime x5 days at minimum, potentially extend to 7 days given profound symptoms and immunocompromise state -Blood cultures remain negative thus far  Marginal  zone lymphoma:  - Stage III, low grade MZL started on bendamustine, rituximab 06/22/20, completed Dec 2021, PET scan 03/02/21 showed disease progression for which zanubrutinib was started 04/12/2021.  - Scheduled to have follow up CT scan prior to next visit with oncology Oct 2022.  - Hold chemotherapy for now given active ongoing infection meeting severe sepsis criteria  Hypokalemia:  -Follow daily labs, increase p.o. intake as tolerated  Diabetes mellitus type 2, uncontrolled with hyperglycemia:  -Uncontrolled HbA1c 9.4; previously 7.9% 02/18/2021, has been consistently elevated back to 8.2% 07/18/2018. - Has not been on treatment prior to admission -Remains off steroids, continue sliding scale and glargine 10 units daily - Continue new start linagliptin  Chronic anemia of chronic disease:  -Likely concurrently hemodilution all given IV fluids as above with hypotension   Thrombocytopenia:  - Acute, likely due to sepsis.  -Resolved.  DVT prophylaxis: Lovenox 40mg  q24h Code Status: Full Family Communication: Lengthy discussion with husband previously who is concerned about Remdesivir "killing her" which we discussed at length the risks and benefits of this medication.  Patient has completed course at this time.  Disposition Plan: Status is: Inpatient  Remains inpatient appropriate because:Persistent severe electrolyte disturbances, Ongoing diagnostic testing needed not appropriate for outpatient work up, IV treatments appropriate due to intensity of illness or inability to take PO, and Inpatient level of care appropriate due to severity of illness  Dispo: The patient is from: Home              Anticipated d/c is to: Home              Patient currently is not medically stable to d/c.  Consultants:  PCCM  Procedures:  None  Antimicrobials: Paxlovid 9/21 Remdesivir 9/22 >> 08/16/2021 Cefepime 9/22 >> 08/16/2021 Vancomycin, flagyl 9/23 >> 08/17/2021  Subjective: Mild low-grade fever  overnight, generally improving daily but not yet back to baseline, p.o. tolerance today remains quite poor, encourage patient for more mobility, increase p.o. intake and likely transfer to general medical floor given improvement in hypotension previously noted  Objective: Vitals:   08/16/21 0334 08/16/21 0400 08/16/21 0500 08/16/21 0644  BP:  (!) 153/70 130/66   Pulse:  93 81   Resp:  13 20   Temp: 98 F (36.7 C)   98.2 F (36.8 C)  TempSrc: Oral   Oral  SpO2:  96% 100%   Weight:      Height:        Intake/Output Summary (Last 24 hours) at 08/16/2021 5701 Last data filed at 08/16/2021 0500 Gross per 24 hour  Intake 3057.13 ml  Output --  Net 3057.13 ml    Filed Weights   08/11/21 1909  Weight: 62.1 kg   Gen: 60 y.o. female in no distress Pulm: Nonlabored breathing room air. Clear. CV: Regular borderline bradycardia. No murmur, rub, or gallop. No JVD, no dependent edema. GI: Abdomen soft, non-tender, non-distended, with normoactive bowel sounds.  Ext: Warm, dry, no deformities. Brisk cap refill in distal extremities with palpable DP and radial pulses.  Skin: No erythema. Toes appear normal.  Neuro: Drowsy but conversant. Oriented but slowed cognitively. No focal neurological deficits. Psych: Judgement and insight appear fair. Mood euthymic & affect congruent. Behavior is appropriate.     Data Reviewed: I have personally reviewed following labs and imaging studies  CBC: Recent Labs  Lab 08/12/21 0415 08/13/21 0500 08/13/21 1957 08/14/21 0307 08/15/21 0558 08/16/21 0518  WBC 12.7* 13.1* 10.4 10.1 7.2 5.0  NEUTROABS 12.2* 11.8*  --  9.1* 6.5 4.2  HGB 10.5* 9.2* 9.5* 9.3* 9.0* 9.3*  HCT 31.5* 27.8* 28.7* 28.2* 27.3* 28.0*  MCV 90.8 92.4 91.4 91.6 91.6 92.4  PLT 190 144* 148* 159 147* 779    Basic Metabolic Panel: Recent Labs  Lab 08/11/21 1623 08/12/21 0415 08/12/21 1541 08/13/21 0500 08/14/21 0307 08/15/21 0558 08/16/21 0518  NA  --  135 136 134* 139 140  138  K  --  2.7* 4.1 3.7 2.9* 3.3* 4.1  CL  --  95* 98 94* 100 104 104  CO2  --  32 27 27 29 26 28   GLUCOSE  --  271* 444* 163* 154* 97 98  BUN  --  7 11 13 10 12 10   CREATININE  --  0.44 0.48 0.49 0.55 0.56 0.57  CALCIUM  --  8.3* 8.5* 8.2* 8.3* 8.0* 8.2*  MG 1.5* 1.9  --   --   --  1.4*  --     GFR: Estimated Creatinine Clearance: 74.2 mL/min (by C-G formula based on SCr of 0.57 mg/dL). Liver Function Tests: Recent Labs  Lab 08/12/21 0415 08/13/21 0500 08/14/21 0307 08/15/21 0558 08/16/21 0518  AST 17 19 21 17 23   ALT 14 14 16 14 14   ALKPHOS 299* 245* 236* 204* 248*  BILITOT 1.4* 0.9 1.0 0.6 0.7  PROT 5.7* 4.9* 5.0* 4.4* 4.7*  ALBUMIN 2.8* 2.3* 2.4* 2.3* 2.4*    No results for input(s): LIPASE, AMYLASE in the last 168 hours. Recent Labs  Lab 08/11/21 1534  AMMONIA 30    Coagulation Profile: No results for input(s): INR, PROTIME in the last 168 hours. Cardiac Enzymes: No results for input(s): CKTOTAL, CKMB, CKMBINDEX, TROPONINI in the last 168 hours. BNP (last 3 results) No results for input(s): PROBNP in the  last 8760 hours. HbA1C: No results for input(s): HGBA1C in the last 72 hours.  CBG: Recent Labs  Lab 08/14/21 2116 08/15/21 0809 08/15/21 1224 08/15/21 1621 08/15/21 2125  GLUCAP 136* 83 127* 220* 108*    Lipid Profile: No results for input(s): CHOL, HDL, LDLCALC, TRIG, CHOLHDL, LDLDIRECT in the last 72 hours. Thyroid Function Tests: No results for input(s): TSH, T4TOTAL, FREET4, T3FREE, THYROIDAB in the last 72 hours.  Anemia Panel: No results for input(s): VITAMINB12, FOLATE, FERRITIN, TIBC, IRON, RETICCTPCT in the last 72 hours. Urine analysis:    Component Value Date/Time   COLORURINE YELLOW (A) 08/11/2021 1506   APPEARANCEUR CLEAR 08/11/2021 1506   LABSPEC <1.005 (L) 08/11/2021 1506   PHURINE 6.5 08/11/2021 1506   GLUCOSEU 100 (A) 08/11/2021 1506   HGBUR NEGATIVE 08/11/2021 1506   BILIRUBINUR NEGATIVE 08/11/2021 1506   KETONESUR  NEGATIVE 08/11/2021 1506   PROTEINUR NEGATIVE 08/11/2021 1506   UROBILINOGEN 0.2 08/09/2007 1957   NITRITE NEGATIVE 08/11/2021 1506   LEUKOCYTESUR TRACE (A) 08/11/2021 1506   Recent Results (from the past 240 hour(s))  Resp Panel by RT-PCR (Flu A&B, Covid) Nasopharyngeal Swab     Status: Abnormal   Collection Time: 08/11/21  3:07 PM   Specimen: Nasopharyngeal Swab; Nasopharyngeal(NP) swabs in vial transport medium  Result Value Ref Range Status   SARS Coronavirus 2 by RT PCR POSITIVE (A) NEGATIVE Final    Comment: RESULT CALLED TO, READ BACK BY AND VERIFIED WITH: T CARBONE,RN 1651 08/11/21 DBRADLEY (NOTE) SARS-CoV-2 target nucleic acids are DETECTED.  The SARS-CoV-2 RNA is generally detectable in upper respiratory specimens during the acute phase of infection. Positive results are indicative of the presence of the identified virus, but do not rule out bacterial infection or co-infection with other pathogens not detected by the test. Clinical correlation with patient history and other diagnostic information is necessary to determine patient infection status. The expected result is Negative.  Fact Sheet for Patients: EntrepreneurPulse.com.au  Fact Sheet for Healthcare Providers: IncredibleEmployment.be  This test is not yet approved or cleared by the [place] FDA and  has been authorized for detection and/or diagnosis of SARS-CoV-2 by FDA under an Emergency Use Authorization (EUA).  This EUA will remain in effect (meaning this test can b e used) for the duration of  the COVID-19 declaration under Section 564(b)(1) of the Act, 21 U.S.C. section 360bbb-3(b)(1), unless the authorization is terminated or revoked sooner.     Influenza A by PCR NEGATIVE NEGATIVE Final   Influenza B by PCR NEGATIVE NEGATIVE Final    Comment: (NOTE) The Xpert Xpress SARS-CoV-2/FLU/RSV plus assay is intended as an aid in the diagnosis of influenza from  Nasopharyngeal swab specimens and should not be used as a sole basis for treatment. Nasal washings and aspirates are unacceptable for Xpert Xpress SARS-CoV-2/FLU/RSV testing.  Fact Sheet for Patients: EntrepreneurPulse.com.au  Fact Sheet for Healthcare Providers: IncredibleEmployment.be  This test is not yet approved or cleared by the [place] FDA and has been authorized for detection and/or diagnosis of SARS-CoV-2 by FDA under an Emergency Use Authorization (EUA). This EUA will remain in effect (meaning this test can be used) for the duration of the COVID-19 declaration under Section 564(b)(1) of the Act, 21 U.S.C. section 360bbb-3(b)(1), unless the authorization is terminated or revoked.  Performed at [facility], [address], [place], [place] [address]   Culture, blood (routine x 2)     Status: None (Preliminary result)   Collection Time: 08/12/21  4:38  PM   Specimen: BLOOD  Result Value Ref Range Status   Specimen Description   Final    BLOOD BLOOD LEFT FOREARM Performed at [facility] [address] [place], [place] [address]    Special Requests   Final    BOTTLES DRAWN AEROBIC ONLY Blood Culture adequate volume Performed at [facility] [address] [place], [place] [address]    Culture   Final    NO GROWTH 3 DAYS Performed at [facility], [place] [address] [place], [place] [address]    Report Status PENDING  Incomplete  Culture, blood (routine x 2)     Status: None (Preliminary result)   Collection Time: 08/12/21  4:38 PM   Specimen: BLOOD  Result Value Ref Range Status   Specimen Description   Final    BLOOD LEFT ANTECUBITAL Performed at [facility] [address] [place], [place] [address]    Special Requests   Final    BOTTLES DRAWN AEROBIC ONLY Blood Culture adequate volume Performed at [facility] [address] [place], [place] [address]    Culture   Final    NO GROWTH 3 DAYS Performed at [facility], [place] [address] [place], [place] [address]    Report Status PENDING  Incomplete  MRSA Next Gen by PCR, Nasal     Status: None   Collection Time: 08/12/21  6:19 PM   Specimen: Nasal Mucosa; Nasal Swab  Result Value Ref Range Status   MRSA by PCR Next Gen NOT DETECTED NOT DETECTED Final    Comment: (NOTE) The GeneXpert MRSA Assay (FDA approved for NASAL specimens only), is one component of a comprehensive MRSA colonization surveillance program. It is not intended to diagnose MRSA infection nor to guide or monitor treatment for MRSA infections. Test performance is not FDA approved in patients less than 35 years old. Performed at [facility], [place] [address], [place], [place] [address]   Expectorated Sputum Assessment w Gram Stain, Rflx to Resp Cult     Status: None   Collection Time: 08/13/21  4:00 AM   Specimen: Expectorated Sputum  Result Value Ref Range Status   Specimen Description EXPECTORATED SPUTUM  Final   Special Requests NONE  Final   Sputum evaluation   Final    THIS SPECIMEN IS ACCEPTABLE FOR SPUTUM CULTURE Performed at [facility], [place] [address], [place], [place] [address]    Report Status 08/13/2021 FINAL  Final  Culture, Respiratory w Gram Stain     Status: None   Collection Time: 08/13/21  4:00 AM  Result Value Ref Range Status   Specimen Description   Final    EXPECTORATED SPUTUM Performed at [facility], [place] [address], [place], [place] [address]    Special Requests   Final    NONE Reflexed from [id number] Performed at [facility], [place] [address], [place], [place] [address]    Gram Stain   Final    FEW SQUAMOUS EPITHELIAL CELLS PRESENT FEW WBC SEEN FEW GRAM NEGATIVE RODS Performed at [facility], [place] [address], [place],  [address]    Culture ABUNDANT  PSEUDOMONAS AERUGINOSA  Final   Report Status 08/15/2021 FINAL  Final   Organism ID, Bacteria PSEUDOMONAS AERUGINOSA  Final      Susceptibility   Pseudomonas aeruginosa - MIC*    CEFTAZIDIME <=1 SENSITIVE Sensitive  CIPROFLOXACIN <=0.25 SENSITIVE Sensitive     GENTAMICIN <=1 SENSITIVE Sensitive     IMIPENEM 2 SENSITIVE Sensitive     PIP/TAZO <=4 SENSITIVE Sensitive     CEFEPIME 0.5 SENSITIVE Sensitive     * ABUNDANT PSEUDOMONAS AERUGINOSA  MRSA Next Gen by PCR, Nasal     Status: None   Collection Time: 08/14/21  4:27 PM   Specimen: Nasal Mucosa; Nasal Swab  Result Value Ref Range Status   MRSA by PCR Next Gen NOT DETECTED NOT DETECTED Final    Comment: (NOTE) The GeneXpert MRSA Assay (FDA approved for NASAL specimens only), is one component of a comprehensive MRSA colonization surveillance program. It is not intended to diagnose MRSA infection nor to guide or monitor treatment for MRSA infections. Test performance is not FDA approved in patients less than 59 years old. Performed at [facility], [place] [address] [place], [place] [address]       Radiology Studies: No results found.  Scheduled Meds:  albuterol  2 puff Inhalation Q6H   Chlorhexidine Gluconate Cloth  6 each Topical Daily   enoxaparin (LOVENOX) injection  40 mg Subcutaneous Q24H   feeding supplement  237 mL Oral BID BM   feeding supplement  237 mL Oral BID BM   insulin aspart  0-15 Units Subcutaneous TID WC   insulin aspart  0-5 Units Subcutaneous QHS   insulin glargine-yfgn  10 Units Subcutaneous Daily   linagliptin  5 mg Oral Daily   living well with diabetes book   Does not apply Once   multivitamin with minerals  1 tablet Oral Daily   pantoprazole  40 mg Oral Daily   Continuous Infusions:  sodium chloride Stopped (08/16/21 0311)   ceFEPime (MAXIPIME) IV Stopped (08/16/21 0530)   lactated ringers with kcl 100 mL/hr at 08/16/21 0500   remdesivir 100 mg in NS 100 mL Stopped (08/15/21  1054)     LOS: 4 days   Time spent: 35 minutes.  Little Ishikawa, DO [facility] [email or web address] 08/16/2021, 7:14 AM

## 2021-08-17 LAB — GLUCOSE, CAPILLARY: Glucose-Capillary: 112 mg/dL — ABNORMAL HIGH (ref 70–99)

## 2021-08-17 LAB — CULTURE, BLOOD (ROUTINE X 2)
Culture: NO GROWTH
Culture: NO GROWTH
Special Requests: ADEQUATE
Special Requests: ADEQUATE

## 2021-08-17 LAB — CBC
HCT: 28.7 % — ABNORMAL LOW (ref 36.0–46.0)
Hemoglobin: 9.5 g/dL — ABNORMAL LOW (ref 12.0–15.0)
MCH: 30.4 pg (ref 26.0–34.0)
MCHC: 33.1 g/dL (ref 30.0–36.0)
MCV: 91.7 fL (ref 80.0–100.0)
Platelets: 209 10*3/uL (ref 150–400)
RBC: 3.13 MIL/uL — ABNORMAL LOW (ref 3.87–5.11)
RDW: 13.9 % (ref 11.5–15.5)
WBC: 6.2 10*3/uL (ref 4.0–10.5)
nRBC: 0 % (ref 0.0–0.2)

## 2021-08-17 LAB — BASIC METABOLIC PANEL
Anion gap: 9 (ref 5–15)
BUN: 10 mg/dL (ref 6–20)
CO2: 27 mmol/L (ref 22–32)
Calcium: 8 mg/dL — ABNORMAL LOW (ref 8.9–10.3)
Chloride: 99 mmol/L (ref 98–111)
Creatinine, Ser: 0.54 mg/dL (ref 0.44–1.00)
GFR, Estimated: >60 mL/min (ref >60)
Glucose, Bld: 137 mg/dL — ABNORMAL HIGH (ref 70–99)
Potassium: 4 mmol/L (ref 3.5–5.1)
Sodium: 135 mmol/L (ref 135–145)

## 2021-08-17 MED ORDER — PANTOPRAZOLE SODIUM 40 MG PO TBEC: 40.0000 mg | DELAYED_RELEASE_TABLET | Freq: Every day | ORAL | 0 refills | Status: AC

## 2021-08-17 MED ORDER — INSULIN GLARGINE 100 UNIT/ML SOLOSTAR PEN: 10.0000 [IU] | PEN_INJECTOR | Freq: Every day | SUBCUTANEOUS | 0 refills | Status: AC

## 2021-08-17 MED ORDER — BLOOD GLUCOSE MONITOR KIT: PACK | 0 refills | Status: AC

## 2021-08-17 MED ORDER — PEN NEEDLES 32G X 4 MM MISC: 1.0000 | Freq: Three times a day (TID) | 0 refills | Status: AC

## 2021-08-17 MED ORDER — CEPHALEXIN 250 MG PO CAPS: 250.0000 mg | ORAL_CAPSULE | Freq: Four times a day (QID) | ORAL | 0 refills | Status: AC

## 2021-08-17 MED ORDER — LINAGLIPTIN 5 MG PO TABS: 5.0000 mg | ORAL_TABLET | Freq: Every day | ORAL | 0 refills | Status: AC

## 2021-08-17 NOTE — Progress Notes (Addendum)
Nurse gave report to receiving nurse Payton Spark on 5W. Patient transferred with primary nurse, tech and belongings.   Belongings:  Photographer for phone and tablet Eye glasses Clothes Slippers Stylet Dentures (upper/lower) inhaler

## 2021-08-17 NOTE — Discharge Summary (Signed)
Physician Discharge Summary  Sonya Daugherty:[id number] DOB: 1961-10-29 DOA: 08/11/2021  PCP: Chesley Noon, MD  Admit date: 08/11/2021 Discharge date: 08/17/2021  Admitted From: Home Disposition: Home  Recommendations for Outpatient Follow-up:  Follow up with PCP in 1-2 weeks Please obtain BMP/CBC in one week  Discharge Condition: Stable CODE STATUS: Full Diet recommendation: Low-salt low-fat diet  Brief/Interim Summary: Sonya Daugherty is a 60 y.o. 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  In the ED SARS-CoV-2 PCR was found to be positive. After abnormal CXR, CTA chest revealed no PE but showed hilar mass infiltrating mediastinum with left perihilar consolidation concerning for postobstructive pneumonia, presumably due to known lymphoma. Paxlovid was initially given, and subsequently changed to remdesivir. Cefepime initiated for presumed bacterial pneumonia broadened to vancomycin and Flexeril given ongoing profound fevers, worsening respiratory status, and hypotension.   Assessment & Plan:   Covid-19 pneumonia in immunocompromised patient:  -Unvaccinated and currently immunocompromised on oral chemotherapy  -PCR initially positive 08/11/2021 -recommend 10 days of quarantining per CDC guidelines for 08/21/2021 - Completed 5 days of remdesivir - Not hypoxic, no indication for steroids   Severe sepsis due to postobstructive Pseudomonas pneumonia:  -Hypotension resolved with IV fluids and broadened antibiotics  -White count now within normal limits -Fevers downtrending, will complete antibiotics on 08/18/2021   Failure to Thrive -Extremely poor p.o. intake over the past week in the setting of above infections now improving, lengthy discussion about need for increased  p.o. intake most notably free water   Marginal zone lymphoma:  - Stage III, low grade MZL started on bendamustine, rituximab 06/22/20, completed Dec 2021, PET scan 03/02/21 showed disease progression for which zanubrutinib was started 04/12/2021.  - Scheduled to have follow up CT scan prior to next visit with oncology Oct 2022.  -Close follow-up with oncology for reinitiation of chemotherapy per their expertise   Hypokalemia:  -Resolved with increased p.o. intake   Diabetes mellitus type 2, uncontrolled with hyperglycemia:  -Continue new insulin regimen 10 units at bedtime as well as linagliptin -Lengthy discussion about need for ongoing diabetic diet and lifestyle changes -A1c 9.4   Chronic anemia of chronic disease:  -Likely concurrently hemodilution all given IV fluids as above with hypotension    Thrombocytopenia:  - Acute, likely due to sepsis.  -Resolved.   Discharge Instructions Discharge Instructions     Call MD for:  difficulty breathing, headache or visual disturbances   Complete by: As directed    Diet - low sodium heart healthy   Complete by: As directed    Increase activity slowly   Complete by: As directed       Allergies as of 08/17/2021       Reactions   Codeine    Iodinated Diagnostic Agents Rash   Lamisil [terbinafine] Rash        Medication List     STOP taking these medications    ibuprofen 200 MG tablet Commonly known as: ADVIL       TAKE these medications    amphetamine-dextroamphetamine 20 MG tablet Commonly known as: ADDERALL Take 20 mg by mouth 2 (two) times daily.   blood glucose meter kit and supplies Kit Dispense based on patient and insurance preference. Use up to four times daily as directed.   cephALEXin 250 MG capsule Commonly  known as: KEFLEX Take 1 capsule (250 mg total) by mouth 4 (four) times daily for 2 days.   DULoxetine 60 MG capsule Commonly known as: CYMBALTA Take 60 mg by mouth daily.   gabapentin 300 MG  capsule Commonly known as: NEURONTIN Take 300 mg by mouth 3 (three) times daily as needed (pain).   insulin glargine 100 UNIT/ML Solostar Pen Commonly known as: LANTUS Inject 10 Units into the skin at bedtime.   Klor-Con M20 20 MEQ tablet Generic drug: potassium chloride SA Take 20 mEq by mouth 2 (two) times daily.   linagliptin 5 MG Tabs tablet Commonly known as: TRADJENTA Take 1 tablet (5 mg total) by mouth daily.   LORazepam 0.5 MG tablet Commonly known as: ATIVAN Take 0.5 mg by mouth every 6 (six) hours as needed for anxiety.   pantoprazole 40 MG tablet Commonly known as: PROTONIX Take 1 tablet (40 mg total) by mouth daily.   Pen Needles 32G X 4 MM Misc 1 each by Does not apply route 3 (three) times daily before meals.   valACYclovir 1000 MG tablet Commonly known as: VALTREX Take 1 g by mouth 3 (three) times daily as needed (flare up).   vitamin B-12 500 MCG tablet Commonly known as: CYANOCOBALAMIN Take 500 mcg by mouth daily.   Zanubrutinib 80 MG Caps Take 160 mg by mouth in the morning and at bedtime.        Allergies  Allergen Reactions   Codeine    Iodinated Diagnostic Agents Rash   Lamisil [Terbinafine] Rash    Consultations: PCCM   Procedures/Studies: DG Chest 2 View  Result Date: 08/11/2021 CLINICAL DATA:  Altered mental status. EXAM: CHEST - 2 VIEW COMPARISON:  Apr 13, 2007 FINDINGS: A right-sided venous Port-A-Cath is seen with its distal tip noted just beyond the junction of the superior vena cava and right atrium. An ill-defined left suprahilar opacity is seen which is new when compared to the prior study. Small ill-defined slightly nodular appearing opacities are seen within the mid right lung and right lung base. There is no evidence of a pleural effusion or pneumothorax. The heart size and mediastinal contours are within normal limits. The visualized skeletal structures are unremarkable. IMPRESSION: 1. Left suprahilar opacity which may  represent a perihilar mass and/or lymphadenopathy. Correlation with chest CT is recommended. 2. Additional findings which may represent small right-sided pulmonary nodules. Electronically Signed   By: Virgina Norfolk M.D.   On: 08/11/2021 16:06   CT HEAD WO CONTRAST (5MM)  Result Date: 08/11/2021 CLINICAL DATA:  Hyperglycemia. EXAM: CT HEAD WITHOUT CONTRAST TECHNIQUE: Contiguous axial images were obtained from the base of the skull through the vertex without intravenous contrast. COMPARISON:  August 09, 2007 FINDINGS: Brain: There is mild cerebral atrophy with widening of the extra-axial spaces and ventricular dilatation. There are areas of decreased attenuation within the white matter tracts of the supratentorial brain, consistent with microvascular disease changes. Vascular: No hyperdense vessel or unexpected calcification. Skull: Normal. Negative for fracture or focal lesion. Sinuses/Orbits: There is a small right maxillary sinus air-fluid level. Mild bilateral ethmoid sinus and left-sided frontal sinus mucosal thickening is also seen. Other: None. IMPRESSION: 1. Generalized cerebral atrophy. 2. No acute intracranial abnormality. 3. Paranasal sinus disease, as described above. Electronically Signed   By: Virgina Norfolk M.D.   On: 08/11/2021 16:08   CT Angio Chest Pulmonary Embolism (PE) W or WO Contrast  Result Date: 08/12/2021 CLINICAL DATA:  60 year old female positive COVID-19. Abnormal chest x-ray,  left hilum yesterday. EMR states history of lymphoma. Also history of contrast allergy (rash), steroid premedicated this morning. EXAM: CT ANGIOGRAPHY CHEST WITH CONTRAST TECHNIQUE: Multidetector CT imaging of the chest was performed using the standard protocol during bolus administration of intravenous contrast. Multiplanar CT image reconstructions and MIPs were obtained to evaluate the vascular anatomy. CONTRAST:  6mL OMNIPAQUE IOHEXOL 350 MG/ML SOLN COMPARISON:  Chest radiographs 08/11/2021.  Report of chest CTA 01/27/2000 (no images available). FINDINGS: Cardiovascular: Good contrast bolus timing in the pulmonary arterial tree. No focal filling defect identified in the pulmonary arteries to suggest acute pulmonary embolism. Right chest Port-A-Cath in place. Calcified aortic atherosclerosis. Calcified coronary artery atherosclerosis. No cardiomegaly or pericardial effusion. Mediastinum/Nodes: Abnormal soft tissue at the left hilum seems to track contiguously to the precarinal station where 18 mm round soft tissue is present on series 5, image 35. Superimposed small paratracheal and prevascular lymph nodes. No right hilar lymphadenopathy. Lungs/Pleura: Major airways are patent, although there is narrowing of the left lung lobar airways at the hilum. Confluent regional low-density soft tissue which also surrounds and mildly narrows the adjacent pulmonary artery branches encompasses 5-6 cm as seen on series 7, image 62. Beyond that there is perihilar consolidation in the left lower lobe with air bronchograms. The superior segment is most affected and the basilar segments are relatively spared. Left upper lobe and lingula relatively spared. No superimposed pleural effusion. Contralateral right major airways and right lung parenchyma appear more normal. There is a distal peribronchial 1-2 cm area of ground-glass opacity in the right upper lobe abutting the minor fissure on series 11, image 61. No right pleural effusion. Upper Abdomen: Partially visible splenomegaly. Estimated splenic volume 800 mL (normal splenic volume range 83 - 412 mL). surgically absent gallbladder. Negative visible liver. Oval 21 mm enlargement of the right adrenal gland has intermediate density and is indeterminate. Left adrenal gland is within normal limits. Negative visible kidneys and bowel. Musculoskeletal: Osteopenia. Mild T5 anterior wedge compression fracture is age indeterminate but probably chronic. No superimposed acute or  destructive osseous lesion is identified. Review of the MIP images confirms the above findings. IMPRESSION: 1. Negative for acute pulmonary embolus. 2. Left hilar mass-like soft tissue encases and mildly narrows the left hilar airways and pulmonary artery branches, also appears to infiltrate into the mediastinum to the precarinal nodal station. Surrounding left perihilar consolidation with air bronchograms. No pleural effusion. This most resembles left hilar tumor with postobstructive Pneumonia. Lymphoma and Bronchogenic Carcinoma are considerations in this setting. 3. Partially visible splenomegaly, suspicious for active Lymphoma related in this setting. 4. Indeterminate 21 mm right adrenal nodule. 5. Mild subpleural ground-glass opacity in the right upper lobe, nonspecific but might be a focus of COVID-19 pneumonia. 6. Calcified coronary artery and Aortic Atherosclerosis (ICD10-I70.0). Electronically Signed   By: Genevie Ann M.D.   On: 08/12/2021 07:20     Subjective: No acute issues or events overnight, feeling much herself today improved p.o. intake free water intake ambulating without difficulty denies shortness of breath headache fever chills nausea vomiting   Discharge Exam: Vitals:   08/17/21 0904 08/17/21 0919  BP: 133/82 132/76  Pulse: (!) 110 92  Resp:  20  Temp: 99.9 F (37.7 C) (!) 100.5 F (38.1 C)  SpO2: (!) 89% 100%   Vitals:   08/17/21 0815 08/17/21 0824 08/17/21 0904 08/17/21 0919  BP:  140/72 133/82 132/76  Pulse: 82 84 (!) 110 92  Resp: $Remo'14 13  20  'KlTvg$ Temp:  99.9 F (37.7 C) (!) 100.5 F (38.1 C)  TempSrc:   Oral Oral  SpO2: 95% 98% (!) 89% 100%  Weight:      Height:        General: Pt is alert, awake, not in acute distress Cardiovascular: RRR, S1/S2 +, no rubs, no gallops Respiratory: CTA bilaterally, no wheezing, no rhonchi Abdominal: Soft, NT, ND, bowel sounds + Extremities: no edema, no cyanosis    The results of significant diagnostics from this  hospitalization (including imaging, microbiology, ancillary and laboratory) are listed below for reference.     Microbiology: Recent Results (from the past 240 hour(s))  Resp Panel by RT-PCR (Flu A&B, Covid) Nasopharyngeal Swab     Status: Abnormal   Collection Time: 08/11/21  3:07 PM   Specimen: Nasopharyngeal Swab; Nasopharyngeal(NP) swabs in vial transport medium  Result Value Ref Range Status   SARS Coronavirus 2 by RT PCR POSITIVE (A) NEGATIVE Final    Comment: RESULT CALLED TO, READ BACK BY AND VERIFIED WITH: T CARBONE,RN 1651 08/11/21 DBRADLEY (NOTE) SARS-CoV-2 target nucleic acids are DETECTED.  The SARS-CoV-2 RNA is generally detectable in upper respiratory specimens during the acute phase of infection. Positive results are indicative of the presence of the identified virus, but do not rule out bacterial infection or co-infection with other pathogens not detected by the test. Clinical correlation with patient history and other diagnostic information is necessary to determine patient infection status. The expected result is Negative.  Fact Sheet for Patients: EntrepreneurPulse.com.au  Fact Sheet for Healthcare Providers: IncredibleEmployment.be  This test is not yet approved or cleared by the [place] FDA and  has been authorized for detection and/or diagnosis of SARS-CoV-2 by FDA under an Emergency Use Authorization (EUA).  This EUA will remain in effect (meaning this test can b e used) for the duration of  the COVID-19 declaration under Section 564(b)(1) of the Act, 21 U.S.C. section 360bbb-3(b)(1), unless the authorization is terminated or revoked sooner.     Influenza A by PCR NEGATIVE NEGATIVE Final   Influenza B by PCR NEGATIVE NEGATIVE Final    Comment: (NOTE) The Xpert Xpress SARS-CoV-2/FLU/RSV plus assay is intended as an aid in the diagnosis of influenza from Nasopharyngeal swab specimens and should not be used as a  sole basis for treatment. Nasal washings and aspirates are unacceptable for Xpert Xpress SARS-CoV-2/FLU/RSV testing.  Fact Sheet for Patients: EntrepreneurPulse.com.au  Fact Sheet for Healthcare Providers: IncredibleEmployment.be  This test is not yet approved or cleared by the [place] FDA and has been authorized for detection and/or diagnosis of SARS-CoV-2 by FDA under an Emergency Use Authorization (EUA). This EUA will remain in effect (meaning this test can be used) for the duration of the COVID-19 declaration under Section 564(b)(1) of the Act, 21 U.S.C. section 360bbb-3(b)(1), unless the authorization is terminated or revoked.  Performed at [facility], [address] [place], [place] [address]   Culture, blood (routine x 2)     Status: None (Preliminary result)   Collection Time: 08/12/21  4:38 PM   Specimen: BLOOD  Result Value Ref Range Status   Specimen Description   Final    BLOOD BLOOD LEFT FOREARM Performed at [facility] [address], [place], [place] [address]    Special Requests   Final    BOTTLES DRAWN AEROBIC ONLY Blood Culture adequate volume Performed at [facility] [address], [place], [place] [address]    Culture   Final  NO GROWTH 4 DAYS Performed at [facility], [address], [place], [place] [address]    Report Status PENDING  Incomplete  Culture, blood (routine x 2)     Status: None (Preliminary result)   Collection Time: 08/12/21  4:38 PM   Specimen: BLOOD  Result Value Ref Range Status   Specimen Description   Final    BLOOD LEFT ANTECUBITAL Performed at [facility], [address], [place], [place] [address]    Special Requests   Final    BOTTLES DRAWN AEROBIC ONLY Blood Culture adequate volume Performed at [facility], [address] [place], [place] [address]    Culture   Final     NO GROWTH 4 DAYS Performed at [facility], [address] [place], [place] [address]    Report Status PENDING  Incomplete  MRSA Next Gen by PCR, Nasal     Status: None   Collection Time: 08/12/21  6:19 PM   Specimen: Nasal Mucosa; Nasal Swab  Result Value Ref Range Status   MRSA by PCR Next Gen NOT DETECTED NOT DETECTED Final    Comment: (NOTE) The GeneXpert MRSA Assay (FDA approved for NASAL specimens only), is one component of a comprehensive MRSA colonization surveillance program. It is not intended to diagnose MRSA infection nor to guide or monitor treatment for MRSA infections. Test performance is not FDA approved in patients less than 23 years old. Performed at [facility], [address] [place], [place] [address]   Expectorated Sputum Assessment w Gram Stain, Rflx to Resp Cult     Status: None   Collection Time: 08/13/21  4:00 AM   Specimen: Expectorated Sputum  Result Value Ref Range Status   Specimen Description EXPECTORATED SPUTUM  Final   Special Requests NONE  Final   Sputum evaluation   Final    THIS SPECIMEN IS ACCEPTABLE FOR SPUTUM CULTURE Performed at [facility], [address] [place], [place] [address]    Report Status 08/13/2021 FINAL  Final  Culture, Respiratory w Gram Stain     Status: None   Collection Time: 08/13/21  4:00 AM  Result Value Ref Range Status   Specimen Description   Final    EXPECTORATED SPUTUM Performed at [facility], [address] [place], [place] [address]    Special Requests   Final    NONE Reflexed from [id number] Performed at [facility], [address], [place], [place] [address]    Gram Stain   Final    FEW SQUAMOUS EPITHELIAL CELLS PRESENT FEW WBC SEEN FEW GRAM NEGATIVE RODS Performed at [facility], [address] [place], [place] [address]    Culture ABUNDANT PSEUDOMONAS AERUGINOSA  Final   Report Status 08/15/2021 FINAL   Final   Organism ID, Bacteria PSEUDOMONAS AERUGINOSA  Final      Susceptibility   Pseudomonas aeruginosa - MIC*    CEFTAZIDIME <=1 SENSITIVE Sensitive     CIPROFLOXACIN <=0.25 SENSITIVE Sensitive     GENTAMICIN <=1 SENSITIVE Sensitive     IMIPENEM 2 SENSITIVE Sensitive     PIP/TAZO <=4 SENSITIVE Sensitive     CEFEPIME 0.5 SENSITIVE Sensitive     * ABUNDANT PSEUDOMONAS AERUGINOSA  MRSA Next Gen by PCR, Nasal     Status: None   Collection Time: 08/14/21  4:27 PM   Specimen: Nasal Mucosa; Nasal Swab  Result Value Ref Range Status  MRSA by PCR Next Gen NOT DETECTED NOT DETECTED Final    Comment: (NOTE) The GeneXpert MRSA Assay (FDA approved for NASAL specimens only), is one component of a comprehensive MRSA colonization surveillance program. It is not intended to diagnose MRSA infection nor to guide or monitor treatment for MRSA infections. Test performance is not FDA approved in patients less than 13 years old. Performed at [facility], [place] [address], [place], [place] [address]      Labs: BNP (last 3 results) No results for input(s): BNP in the last 8760 hours. Basic Metabolic Panel: Recent Labs  Lab 08/11/21 1623 08/12/21 0415 08/12/21 1541 08/13/21 0500 08/14/21 0307 08/15/21 0558 08/16/21 0518 08/17/21 0249  NA  --  135   < > 134* 139 140 138 135  K  --  2.7*   < > 3.7 2.9* 3.3* 4.1 4.0  CL  --  95*   < > 94* 100 104 104 99  CO2  --  32   < > $R'27 29 26 28 27  'bv$ GLUCOSE  --  271*   < > 163* 154* 97 98 137*  BUN  --  7   < > $R'13 10 12 10 10  'IU$ CREATININE  --  0.44   < > 0.49 0.55 0.56 0.57 0.54  CALCIUM  --  8.3*   < > 8.2* 8.3* 8.0* 8.2* 8.0*  MG 1.5* 1.9  --   --   --  1.4*  --   --    < > = values in this interval not displayed.   Liver Function Tests: Recent Labs  Lab 08/12/21 0415 08/13/21 0500 08/14/21 0307 08/15/21 0558 08/16/21 0518  AST $Re'17 19 21 17 23  'YzK$ ALT $R'14 14 16 14 14  'EP$ ALKPHOS 299* 245* 236* 204* 248*  BILITOT 1.4* 0.9 1.0 0.6  0.7  PROT 5.7* 4.9* 5.0* 4.4* 4.7*  ALBUMIN 2.8* 2.3* 2.4* 2.3* 2.4*   No results for input(s): LIPASE, AMYLASE in the last 168 hours. Recent Labs  Lab 08/11/21 1534  AMMONIA 30   CBC: Recent Labs  Lab 08/12/21 0415 08/13/21 0500 08/13/21 1957 08/14/21 0307 08/15/21 0558 08/16/21 0518 08/17/21 0249  WBC 12.7* 13.1* 10.4 10.1 7.2 5.0 6.2  NEUTROABS 12.2* 11.8*  --  9.1* 6.5 4.2  --   HGB 10.5* 9.2* 9.5* 9.3* 9.0* 9.3* 9.5*  HCT 31.5* 27.8* 28.7* 28.2* 27.3* 28.0* 28.7*  MCV 90.8 92.4 91.4 91.6 91.6 92.4 91.7  PLT 190 144* 148* 159 147* 183 209   Cardiac Enzymes: No results for input(s): CKTOTAL, CKMB, CKMBINDEX, TROPONINI in the last 168 hours. BNP: Invalid input(s): POCBNP CBG: Recent Labs  Lab 08/16/21 0804 08/16/21 1125 08/16/21 1641 08/16/21 2055 08/17/21 0820  GLUCAP 87 143* 132* 150* 112*   D-Dimer No results for input(s): DDIMER in the last 72 hours. Hgb A1c No results for input(s): HGBA1C in the last 72 hours. Lipid Profile No results for input(s): CHOL, HDL, LDLCALC, TRIG, CHOLHDL, LDLDIRECT in the last 72 hours. Thyroid function studies No results for input(s): TSH, T4TOTAL, T3FREE, THYROIDAB in the last 72 hours.  Invalid input(s): FREET3 Anemia work up No results for input(s): VITAMINB12, FOLATE, FERRITIN, TIBC, IRON, RETICCTPCT in the last 72 hours. Urinalysis    Component Value Date/Time   COLORURINE YELLOW (A) 08/11/2021 1506   APPEARANCEUR CLEAR 08/11/2021 1506   LABSPEC <1.005 (L) 08/11/2021 1506   PHURINE 6.5 08/11/2021 1506   GLUCOSEU 100 (A) 08/11/2021 1506  HGBUR NEGATIVE 08/11/2021 1506   BILIRUBINUR NEGATIVE 08/11/2021 1506   KETONESUR NEGATIVE 08/11/2021 1506   PROTEINUR NEGATIVE 08/11/2021 1506   UROBILINOGEN 0.2 08/09/2007 1957   NITRITE NEGATIVE 08/11/2021 1506   LEUKOCYTESUR TRACE (A) 08/11/2021 1506   Sepsis Labs Invalid input(s): PROCALCITONIN,  WBC,  LACTICIDVEN Microbiology Recent Results (from the past 240  hour(s))  Resp Panel by RT-PCR (Flu A&B, Covid) Nasopharyngeal Swab     Status: Abnormal   Collection Time: 08/11/21  3:07 PM   Specimen: Nasopharyngeal Swab; Nasopharyngeal(NP) swabs in vial transport medium  Result Value Ref Range Status   SARS Coronavirus 2 by RT PCR POSITIVE (A) NEGATIVE Final    Comment: RESULT CALLED TO, READ BACK BY AND VERIFIED WITH: T CARBONE,RN 1651 08/11/21 DBRADLEY (NOTE) SARS-CoV-2 target nucleic acids are DETECTED.  The SARS-CoV-2 RNA is generally detectable in upper respiratory specimens during the acute phase of infection. Positive results are indicative of the presence of the identified virus, but do not rule out bacterial infection or co-infection with other pathogens not detected by the test. Clinical correlation with patient history and other diagnostic information is necessary to determine patient infection status. The expected result is Negative.  Fact Sheet for Patients: EntrepreneurPulse.com.au  Fact Sheet for Healthcare Providers: IncredibleEmployment.be  This test is not yet approved or cleared by the [place] FDA and  has been authorized for detection and/or diagnosis of SARS-CoV-2 by FDA under an Emergency Use Authorization (EUA).  This EUA will remain in effect (meaning this test can b e used) for the duration of  the COVID-19 declaration under Section 564(b)(1) of the Act, 21 U.S.C. section 360bbb-3(b)(1), unless the authorization is terminated or revoked sooner.     Influenza A by PCR NEGATIVE NEGATIVE Final   Influenza B by PCR NEGATIVE NEGATIVE Final    Comment: (NOTE) The Xpert Xpress SARS-CoV-2/FLU/RSV plus assay is intended as an aid in the diagnosis of influenza from Nasopharyngeal swab specimens and should not be used as a sole basis for treatment. Nasal washings and aspirates are unacceptable for Xpert Xpress SARS-CoV-2/FLU/RSV testing.  Fact Sheet for  Patients: EntrepreneurPulse.com.au  Fact Sheet for Healthcare Providers: IncredibleEmployment.be  This test is not yet approved or cleared by the [place] FDA and has been authorized for detection and/or diagnosis of SARS-CoV-2 by FDA under an Emergency Use Authorization (EUA). This EUA will remain in effect (meaning this test can be used) for the duration of the COVID-19 declaration under Section 564(b)(1) of the Act, 21 U.S.C. section 360bbb-3(b)(1), unless the authorization is terminated or revoked.  Performed at [facility], [address] [place], [place] [address]   Culture, blood (routine x 2)     Status: None (Preliminary result)   Collection Time: 08/12/21  4:38 PM   Specimen: BLOOD  Result Value Ref Range Status   Specimen Description   Final    BLOOD BLOOD LEFT FOREARM Performed at [facility] [address], [place], [place] [address]    Special Requests   Final    BOTTLES DRAWN AEROBIC ONLY Blood Culture adequate volume Performed at [facility] [address] [place], [place] [address]    Culture   Final    NO GROWTH 4 DAYS Performed at [facility], [place] [address], [place], [place] [address]    Report Status PENDING  Incomplete  Culture, blood (routine x 2)     Status: None (Preliminary result)   Collection Time: 08/12/21  4:38 PM   Specimen: BLOOD  Result Value Ref Range Status   Specimen Description   Final    BLOOD LEFT ANTECUBITAL Performed at [facility] [address], [place], [place] [address]    Special Requests   Final    BOTTLES DRAWN AEROBIC ONLY Blood Culture adequate volume Performed at [facility] [address] [place], [place] [address]    Culture   Final    NO GROWTH 4 DAYS Performed at [facility], [place] [address], [place], [place] [address]    Report Status PENDING  Incomplete   MRSA Next Gen by PCR, Nasal     Status: None   Collection Time: 08/12/21  6:19 PM   Specimen: Nasal Mucosa; Nasal Swab  Result Value Ref Range Status   MRSA by PCR Next Gen NOT DETECTED NOT DETECTED Final    Comment: (NOTE) The GeneXpert MRSA Assay (FDA approved for NASAL specimens only), is one component of a comprehensive MRSA colonization surveillance program. It is not intended to diagnose MRSA infection nor to guide or monitor treatment for MRSA infections. Test performance is not FDA approved in patients less than 19 years old. Performed at [facility], [place] [address], [place], [place] [address]   Expectorated Sputum Assessment w Gram Stain, Rflx to Resp Cult     Status: None   Collection Time: 08/13/21  4:00 AM   Specimen: Expectorated Sputum  Result Value Ref Range Status   Specimen Description EXPECTORATED SPUTUM  Final   Special Requests NONE  Final   Sputum evaluation   Final    THIS SPECIMEN IS ACCEPTABLE FOR SPUTUM CULTURE Performed at [facility], [place] [address], [place], [place] [address]    Report Status 08/13/2021 FINAL  Final  Culture, Respiratory w Gram Stain     Status: None   Collection Time: 08/13/21  4:00 AM  Result Value Ref Range Status   Specimen Description   Final    EXPECTORATED SPUTUM Performed at [facility], [place] [address] [place], [place] [address]    Special Requests   Final    NONE Reflexed from [id number] Performed at [facility], [place] [address], [place], [place] [address]    Gram Stain   Final    FEW SQUAMOUS EPITHELIAL CELLS PRESENT FEW WBC SEEN FEW GRAM NEGATIVE RODS Performed at [facility], [place] [address], [place], [place] [address]    Culture ABUNDANT PSEUDOMONAS AERUGINOSA  Final   Report Status 08/15/2021 FINAL  Final   Organism ID, Bacteria PSEUDOMONAS AERUGINOSA  Final      Susceptibility   Pseudomonas aeruginosa - MIC*    CEFTAZIDIME <=1  SENSITIVE Sensitive     CIPROFLOXACIN <=0.25 SENSITIVE Sensitive     GENTAMICIN <=1 SENSITIVE Sensitive     IMIPENEM 2 SENSITIVE Sensitive     PIP/TAZO <=4 SENSITIVE Sensitive     CEFEPIME 0.5 SENSITIVE Sensitive     * ABUNDANT PSEUDOMONAS AERUGINOSA  MRSA Next Gen by PCR, Nasal     Status: None   Collection Time: 08/14/21  4:27 PM   Specimen: Nasal Mucosa; Nasal Swab  Result Value Ref Range Status   MRSA by PCR Next Gen NOT DETECTED NOT DETECTED Final    Comment: (NOTE) The GeneXpert MRSA Assay (FDA approved for NASAL specimens only), is one component of a comprehensive MRSA colonization surveillance program. It is not intended to diagnose MRSA infection nor  to guide or monitor treatment for MRSA infections. Test performance is not FDA approved in patients less than 3 years old. Performed at [facility], [place] [address] [place], [place] [address]      Time coordinating discharge: Over 30 minutes  SIGNED:   Little Ishikawa, DO [facility] 08/17/2021, 12:02 PM Pager   If 7PM-7AM, please contact night-coverage [email or web address]

## 2021-08-17 NOTE — TOC Progression Note (Signed)
Transition of Care [facility]) - Progression Note    Patient Details  Name: Sonya Daugherty MRN: [id number] Date of Birth: 01-09-1961  Transition of Care [facility]) CM/SW Contact  Leeroy Cha, RN Phone Number: 08/17/2021, 8:52 AM  Clinical Narrative:    60 y.o. 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  In the ED SARS-CoV-2 PCR was found to be positive. After abnormal CXR, CTA chest revealed no PE but showed hilar mass infiltrating mediastinum with left perihilar consolidation concerning for postobstructive pneumonia, presumably due to known lymphoma. Paxlovid was initially given, and subsequently changed to remdesivir. Cefepime initiated for presumed bacterial pneumonia broadened to vancomycin and Flexeril given ongoing profound fevers, worsening respiratory status, and hypotension.   Assessment & Plan:   Covid-19 pneumonia in immunocompromised patient:  -Unvaccinated currently immunocompromised on oral chemotherapy  -PCR + 08/11/2021  -previously positive in the outpatient setting (?Home test) but patient is not clear on dates -all previous COVID testing in the system is negative but well over 6 months old - Completed 5 days of remdesivir - Not hypoxic, no indication for steroids - Isolation x10 days per current guidelines - IS, FV, OOB, proning as tolerated   Severe sepsis due to postobstructive pneumonia:  -Hypotension resolved with IV fluids and broadened antibiotics  -White count now within normal limits -Fevers ongoing but downtrending appropriately, mild fever over the past 24 hours -Sputum culture remarkable for Pseudomonas aeruginosa -appears to be broadly sensitive, will continue cefepime x5 days at minimum, potentially extend to 7 days given profound symptoms and  immunocompromise state -Blood cultures remain negative thus far   Marginal zone lymphoma:  - Stage III, low grade MZL started on bendamustine, rituximab 06/22/20, completed Dec 2021, PET scan 03/02/21 showed disease progression for which zanubrutinib was started 04/12/2021.  - Scheduled to have follow up CT scan prior to next visit with oncology Oct 2022.  - Hold chemotherapy for now given active ongoing infection meeting severe sepsis criteria   Hypokalemia:  -Follow daily labs, increase p.o. intake as tolerated   Diabetes mellitus type 2, uncontrolled with hyperglycemia:  -Uncontrolled HbA1c 9.4; previously 7.9% 02/18/2021, has been consistently elevated back to 8.2% 07/18/2018. - Has not been on treatment prior to admission -Remains off steroids, continue sliding scale and glargine 10 units daily - Continue new start linagliptin   Chronic anemia of chronic disease:  -Likely concurrently hemodilution all given IV fluids as above with hypotension    Thrombocytopenia:  - Acute, likely due to sepsis.  -Resolved.   DVT prophylaxis: Lovenox 40mg  q24h Code Status: Full Family Communication: Lengthy discussion with husband previously who is concerned about Remdesivir "killing her" which we discussed at length the risks and benefits of this medication.  Patient has completed course at this time. TOC PLAN OF CARE: Plan is to return to home when stable. Following for toc needs and progression.    Barriers to Discharge: Continued Medical Work up  Expected Discharge Plan and Services                                                 Social Determinants of Health (SDOH) Interventions    Readmission Risk Interventions  No flowsheet data found.

## 2021-08-17 NOTE — Progress Notes (Signed)
Nurse followed up with [place], DO in reference to patient's IV fluids. Provider is [place] with d/c of fluids at this time. As patient is eating and drinking on her own. Patient had approximately 200 ml void prior to transfer to the floor. No new orders at this time.

## 2021-08-17 NOTE — Plan of Care (Signed)
  Problem: Nutrition: Goal: Adequate nutrition will be maintained Outcome: Progressing   Problem: Elimination: Goal: Will not experience complications related to bowel motility Outcome: Progressing   Problem: Pain Managment: Goal: General experience of comfort will improve Outcome: Progressing   

## 2021-08-17 NOTE — Progress Notes (Signed)
08/17/2021  1200  Patient called out stating she is weak and about to fall. Went into the room, patient was found sqatting at bedside. I had to help her up to sit on the bed. Patient begged not to tell anyone, advised her that I had to inform the provider. Notified MD and he saw patient and wanted to continue with discharge. Patient son was inform of her weak episode and states he believe she just needed to go home to her own bed and patient agreed. Pt d/c home.

## 2021-08-19 ENCOUNTER — Encounter: Payer: Self-pay | Admitting: Family Medicine

## 2021-11-21 DEATH — deceased
